# Patient Record
Sex: Female | Born: 1950 | Race: White | Hispanic: Yes | Smoking: Never smoker
Health system: Southern US, Community
[De-identification: ages and names within clinical notes are randomized; demographics above are authoritative.]

## PROBLEM LIST (undated history)

## (undated) DIAGNOSIS — H269 Unspecified cataract: Secondary | ICD-10-CM

## (undated) DIAGNOSIS — E785 Hyperlipidemia, unspecified: Secondary | ICD-10-CM

## (undated) DIAGNOSIS — K635 Polyp of colon: Secondary | ICD-10-CM

## (undated) DIAGNOSIS — T7840XA Allergy, unspecified, initial encounter: Secondary | ICD-10-CM

## (undated) DIAGNOSIS — J309 Allergic rhinitis, unspecified: Secondary | ICD-10-CM

## (undated) DIAGNOSIS — M8580 Other specified disorders of bone density and structure, unspecified site: Secondary | ICD-10-CM

## (undated) DIAGNOSIS — K219 Gastro-esophageal reflux disease without esophagitis: Secondary | ICD-10-CM

## (undated) HISTORY — DX: Hyperlipidemia, unspecified: E78.5

## (undated) HISTORY — PX: WISDOM TOOTH EXTRACTION: SHX21

## (undated) HISTORY — PX: UPPER GASTROINTESTINAL ENDOSCOPY: SHX188

## (undated) HISTORY — PX: BUNIONECTOMY: SHX129

## (undated) HISTORY — PX: VAGINAL HYSTERECTOMY: SUR661

## (undated) HISTORY — DX: Other specified disorders of bone density and structure, unspecified site: M85.80

## (undated) HISTORY — DX: Allergic rhinitis, unspecified: J30.9

## (undated) HISTORY — DX: Unspecified cataract: H26.9

## (undated) HISTORY — PX: HAMMER TOE SURGERY: SHX385

## (undated) HISTORY — PX: COLONOSCOPY: SHX174

## (undated) HISTORY — DX: Polyp of colon: K63.5

## (undated) HISTORY — PX: TURBINATE REDUCTION: SHX6157

## (undated) HISTORY — DX: Gastro-esophageal reflux disease without esophagitis: K21.9

## (undated) HISTORY — DX: Allergy, unspecified, initial encounter: T78.40XA

## (undated) HISTORY — PX: OTHER SURGICAL HISTORY: SHX169

## (undated) HISTORY — PX: TONSILLECTOMY AND ADENOIDECTOMY: SUR1326

---

## 2001-08-15 ENCOUNTER — Encounter: Payer: Self-pay | Admitting: Internal Medicine

## 2004-06-30 ENCOUNTER — Encounter: Payer: Self-pay | Admitting: Internal Medicine

## 2013-02-22 ENCOUNTER — Encounter: Payer: Self-pay | Admitting: Internal Medicine

## 2016-12-14 DIAGNOSIS — Z9109 Other allergy status, other than to drugs and biological substances: Secondary | ICD-10-CM | POA: Diagnosis not present

## 2016-12-14 DIAGNOSIS — K219 Gastro-esophageal reflux disease without esophagitis: Secondary | ICD-10-CM | POA: Diagnosis not present

## 2016-12-14 DIAGNOSIS — E785 Hyperlipidemia, unspecified: Secondary | ICD-10-CM | POA: Diagnosis not present

## 2016-12-16 DIAGNOSIS — Z1231 Encounter for screening mammogram for malignant neoplasm of breast: Secondary | ICD-10-CM | POA: Diagnosis not present

## 2017-04-21 DIAGNOSIS — H2513 Age-related nuclear cataract, bilateral: Secondary | ICD-10-CM | POA: Diagnosis not present

## 2017-05-13 DIAGNOSIS — J019 Acute sinusitis, unspecified: Secondary | ICD-10-CM | POA: Diagnosis not present

## 2017-05-13 DIAGNOSIS — N390 Urinary tract infection, site not specified: Secondary | ICD-10-CM | POA: Diagnosis not present

## 2017-06-29 ENCOUNTER — Encounter: Payer: Self-pay | Admitting: Pulmonary Disease

## 2017-06-29 ENCOUNTER — Ambulatory Visit (INDEPENDENT_AMBULATORY_CARE_PROVIDER_SITE_OTHER): Payer: Medicare Other | Admitting: Pulmonary Disease

## 2017-06-29 ENCOUNTER — Ambulatory Visit (INDEPENDENT_AMBULATORY_CARE_PROVIDER_SITE_OTHER)
Admission: RE | Admit: 2017-06-29 | Discharge: 2017-06-29 | Disposition: A | Discharge status: Home / Self Care | Payer: Medicare Other | Source: Ambulatory Visit | Attending: Pulmonary Disease | Admitting: Pulmonary Disease

## 2017-06-29 ENCOUNTER — Other Ambulatory Visit (INDEPENDENT_AMBULATORY_CARE_PROVIDER_SITE_OTHER): Payer: Medicare Other

## 2017-06-29 VITALS — BP 130/78 | HR 92 | Ht 63.0 in | Wt 187.0 lb

## 2017-06-29 DIAGNOSIS — R05 Cough: Secondary | ICD-10-CM | POA: Diagnosis not present

## 2017-06-29 DIAGNOSIS — R059 Cough, unspecified: Secondary | ICD-10-CM

## 2017-06-29 LAB — CBC WITH DIFFERENTIAL/PLATELET
BASOS ABS: 0.1 10*3/uL (ref 0.0–0.1)
Basophils Relative: 0.6 % (ref 0.0–3.0)
EOS ABS: 0.1 10*3/uL (ref 0.0–0.7)
Eosinophils Relative: 0.6 % (ref 0.0–5.0)
HEMATOCRIT: 44 % (ref 36.0–46.0)
Hemoglobin: 15 g/dL (ref 12.0–15.0)
LYMPHS PCT: 16.8 % (ref 12.0–46.0)
Lymphs Abs: 1.4 10*3/uL (ref 0.7–4.0)
MCHC: 34.1 g/dL (ref 30.0–36.0)
MCV: 87.3 fl (ref 78.0–100.0)
MONOS PCT: 7.2 % (ref 3.0–12.0)
Monocytes Absolute: 0.6 10*3/uL (ref 0.1–1.0)
Neutro Abs: 6.4 10*3/uL (ref 1.4–7.7)
Neutrophils Relative %: 74.8 % (ref 43.0–77.0)
Platelets: 372 10*3/uL (ref 150.0–400.0)
RBC: 5.04 Mil/uL (ref 3.87–5.11)
RDW: 13.4 % (ref 11.5–15.5)
WBC: 8.5 10*3/uL (ref 4.0–10.5)

## 2017-06-29 LAB — NITRIC OXIDE: Nitric Oxide: 11

## 2017-06-29 NOTE — Patient Instructions (Signed)
We will get CBC differential, blood allergy profile Schedule you for a chest x-ray and pulmonary function test Use Chlor-Trimeton over-the-counter 8 mg 3 times daily and resume Prilosec 40 mg daily for 2 weeks Follow-up in 1 month for review of symptoms and tests.

## 2017-06-29 NOTE — Progress Notes (Signed)
Dana Cobb    [id number]    14-Oct-1950  Primary Care Physician:Patient, No Pcp Per  Referring Physician: No referring provider defined for this encounter.  Chief complaint: Consult for chronic cough  HPI: 67 year old with episodes of recurrent nonproductive cough.  Usually starts with a viral infection, sinus infection and has prolonged cough for several months. She had another episode of upper respiratory tract/sinus infection in January which was treated with Augmentin.  She continues to have cough, nonproductive in nature, irritation at the back of the throat. She has acid reflux and uses Prilosec intermittently.  Has seasonal allergies, reports sensitivity to perfumes, airfreshners, candles, cats.  She reports an asthma exacerbation 2017 when she was visiting relatives with cats and candles.  This was treated with prednisone and inhalers.  States that prednisone give her weird feelings, insomnia  Pets: Used to have a cat.  Reports that she is possibly allergic to it.  No pets now.  No birds Occupation: Retired Dietitianprogram manager for [employer] Exposures: No known exposures, no mold.  Lives in a house with new carpet, hardwood, tile.  No hot tubs, down pillows or comforters Smoking history: Never smoker.  Reports significant exposure to secondhand smoke while growing up Travel History: Born in [place].  [place]ived in [place], [place] from [place] to be closer to family.  Outpatient Encounter Medications as of 06/29/2017  Medication Sig  . atorvastatin (LIPITOR) 40 MG tablet Take 40 mg by mouth daily.  . Estradiol (VAGIFEM) 10 MCG TABS vaginal tablet Place vaginally.  . montelukast (SINGULAIR) 10 MG tablet Take 10 mg by mouth at bedtime.  Marland Kitchen. omeprazole (PRILOSEC) 40 MG capsule Take 40 mg by mouth as needed.   No facility-administered encounter medications on file as of 06/29/2017.      Allergies as of 06/29/2017 - Review Complete 06/29/2017  Allergen Reaction Noted  . Codeine Anaphylaxis 06/29/2017    Past Medical History:  Diagnosis Date  . Allergic rhinitis   . Hyperlipidemia     Past Surgical History:  Procedure Laterality Date  . VAGINAL HYSTERECTOMY    . WISDOM TOOTH EXTRACTION      Family History  Problem Relation Age of Onset  . Heart disease Mother   . Stroke Mother   . Heart disease Father   . Stroke Maternal Grandmother   . Cancer Maternal Grandmother   . Aneurysm Maternal Grandfather     Social History   Socioeconomic History  . Marital status: Divorced    Spouse name: Not on file  . Number of children: Not on file  . Years of education: Not on file  . Highest education level: Not on file  Social Needs  . Financial resource strain: Not on file  . Food insecurity - worry: Not on file  . Food insecurity - inability: Not on file  . Transportation needs - medical: Not on file  . Transportation needs - non-medical: Not on file  Occupational History  . Not on file  Tobacco Use  . Smoking status: Never Smoker  . Smokeless tobacco: Never Used  Substance and Sexual Activity  . Alcohol use: No    Frequency: Never  . Drug use: No  . Sexual activity: Not on file  Other Topics Concern  . Not on file  Social History Narrative  . Not on file    Review of systems: Review of  Systems  Constitutional: Negative for fever and chills.  HENT: Negative.   Eyes: Negative for blurred vision.  Respiratory: as per HPI  Cardiovascular: Negative for chest pain and palpitations.  Gastrointestinal: Negative for vomiting, diarrhea, blood per rectum. Genitourinary: Negative for dysuria, urgency, frequency and hematuria.  Musculoskeletal: Negative for myalgias, back pain and joint pain.  Skin: Negative for itching and rash.  Neurological: Negative for dizziness, tremors, focal weakness, seizures and loss of consciousness.  Endo/Heme/Allergies:  Negative for environmental allergies.  Psychiatric/Behavioral: Negative for depression, suicidal ideas and hallucinations.  All other systems reviewed and are negative.  Physical Exam: Blood pressure 130/78, pulse 92, height 5\' 3"  (1.6 m), weight 187 lb (84.8 kg), SpO2 97 %. Gen:      No acute distress HEENT:  EOMI, sclera anicteric Neck:     No masses; no thyromegaly Lungs:    Clear to auscultation bilaterally; normal respiratory effort CV:         Regular rate and rhythm; no murmurs Abd:      + bowel sounds; soft, non-tender; no palpable masses, no distension Ext:    No edema; adequate peripheral perfusion Skin:      Warm and dry; no rash Neuro: alert and oriented x 3 Psych: normal mood and affect  Data Reviewed: FENO 06/29/17-11  Assessment:  Chronic cough Suspect postviral, post bronchitis cough.  She likely has persistent symptoms due to ongoing postnasal drip and acid reflux Need to evaluate for asthma given her sensitivity to cats, scents.   Treat postnasal drip with chlorpheniramine.  She has not tolerated nasal sprays due to epistaxis in the past hence we will avoid We will restart on Prilosec 40 mg a day for treatment of GERD Check CBC differential, blood allergy profile, PFTs and chest x-ray.  I educated her on behavioral changes to deal with cough including conscious suppression of the urge to cough, use of throat lozenges.  Plan/Recommendations: - Chest x-ray, PFTs, CBC differential, blood allergy profile - Chlorphentermine 8 mg 3 times daily, Prilosec 40 mg daily  Chilton Greathouse MD [facility] Pager [phone] 06/29/2017, 11:34 AM  CC: No ref. provider found

## 2017-06-30 LAB — RESPIRATORY ALLERGY PROFILE REGION II ~~LOC~~
Allergen, Cedar tree, t12: <0.1 kU/L
Allergen, Comm Silver Birch, t9: <0.1 kU/L
Allergen, Cottonwood, t14: <0.1 kU/L
Allergen, Mulberry, t76: <0.1 kU/L
Allergen, P. notatum, m1: <0.1 kU/L
Aspergillus fumigatus, m3: <0.1 kU/L
Bermuda Grass: <0.1 kU/L
CLADOSPORIUM HERBARUM (M2) IGE: <0.1 kU/L
CLASS: 0
CLASS: 0
CLASS: 0
CLASS: 0
CLASS: 0
CLASS: 0
CLASS: 0
CLASS: 0
CLASS: 0
CLASS: 0
CLASS: 0
COMMON RAGWEED (SHORT) (W1) IGE: <0.1 kU/L
Cat Dander: <0.1 kU/L
Class: 0
Class: 0
Class: 0
Class: 0
Class: 0
Class: 0
Class: 0
Class: 0
Class: 0
Class: 0
Class: 0
Class: 0
Class: 0
D. farinae: <0.1 kU/L
Dog Dander: <0.1 kU/L
IgE (Immunoglobulin E), Serum: 4 kU/L (ref <=114)
Pecan/Hickory Tree IgE: <0.1 kU/L
Rough Pigweed  IgE: <0.1 kU/L

## 2017-06-30 LAB — INTERPRETATION:

## 2017-08-02 ENCOUNTER — Ambulatory Visit (INDEPENDENT_AMBULATORY_CARE_PROVIDER_SITE_OTHER): Payer: Medicare Other | Admitting: Pulmonary Disease

## 2017-08-02 ENCOUNTER — Ambulatory Visit: Payer: Medicare Other | Admitting: Pulmonary Disease

## 2017-08-02 DIAGNOSIS — R059 Cough, unspecified: Secondary | ICD-10-CM

## 2017-08-02 DIAGNOSIS — R05 Cough: Secondary | ICD-10-CM

## 2017-08-02 LAB — PULMONARY FUNCTION TEST
DL/VA % pred: 85 %
DL/VA: 4.08 ml/min/mmHg/L
DLCO COR % PRED: 82 %
DLCO UNC: 20.58 ml/min/mmHg
DLCO cor: 19.68 ml/min/mmHg
DLCO unc % pred: 86 %
FEF 25-75 POST: 3.75 L/s
FEF 25-75 Pre: 3.78 L/sec
FEF2575-%Change-Post: 0 %
FEF2575-%Pred-Post: 183 %
FEF2575-%Pred-Pre: 185 %
FEV1-%Change-Post: -1 %
FEV1-%PRED-POST: 122 %
FEV1-%PRED-PRE: 124 %
FEV1-POST: 2.87 L
FEV1-Pre: 2.91 L
FEV1FVC-%Change-Post: 3 %
FEV1FVC-%Pred-Pre: 112 %
FEV6-%CHANGE-POST: -4 %
FEV6-%PRED-PRE: 114 %
FEV6-%Pred-Post: 109 %
FEV6-POST: 3.22 L
FEV6-PRE: 3.37 L
FEV6FVC-%PRED-POST: 104 %
FEV6FVC-%PRED-PRE: 104 %
FVC-%CHANGE-POST: -4 %
FVC-%PRED-POST: 104 %
FVC-%PRED-PRE: 109 %
FVC-POST: 3.22 L
FVC-Pre: 3.37 L
POST FEV6/FVC RATIO: 100 %
PRE FEV1/FVC RATIO: 86 %
Post FEV1/FVC ratio: 89 %
Pre FEV6/FVC Ratio: 100 %
RV % pred: 117 %
RV: 2.47 L
TLC % PRED: 121 %
TLC: 6.08 L

## 2017-08-02 NOTE — Progress Notes (Signed)
PFT done today. 

## 2017-08-10 ENCOUNTER — Ambulatory Visit: Payer: Medicare Other | Admitting: Pulmonary Disease

## 2017-08-16 ENCOUNTER — Ambulatory Visit: Payer: Medicare Other | Admitting: Pulmonary Disease

## 2017-08-17 ENCOUNTER — Telehealth: Payer: Self-pay | Admitting: Pulmonary Disease

## 2017-08-17 NOTE — Telephone Encounter (Signed)
Called and spoke with patient, advised her of PFT results. Nothing further needed.

## 2017-09-14 ENCOUNTER — Ambulatory Visit (INDEPENDENT_AMBULATORY_CARE_PROVIDER_SITE_OTHER): Payer: Medicare Other | Admitting: Pulmonary Disease

## 2017-09-14 ENCOUNTER — Encounter: Payer: Self-pay | Admitting: Pulmonary Disease

## 2017-09-14 VITALS — BP 138/82 | HR 78 | Ht 63.0 in | Wt 186.4 lb

## 2017-09-14 DIAGNOSIS — R05 Cough: Secondary | ICD-10-CM

## 2017-09-14 DIAGNOSIS — R059 Cough, unspecified: Secondary | ICD-10-CM

## 2017-09-14 NOTE — Progress Notes (Signed)
Dana Cobb    [id number]    1951-04-03  Primary Care Physician:Patient, No Pcp Per  Referring Physician: No referring provider defined for this encounter.  Chief complaint: Follow-up for chronic cough  HPI: 67 year old with episodes of recurrent nonproductive cough.  Usually starts with a viral infection, sinus infection and has prolonged cough for several months. She had another episode of upper respiratory tract/sinus infection in January which was treated with Augmentin.  She continues to have cough, nonproductive in nature, irritation at the back of the throat. She has acid reflux and uses Prilosec intermittently.  Has seasonal allergies, reports sensitivity to perfumes, airfreshners, candles, cats.  She reports an asthma exacerbation 2017 when she was visiting relatives with cats and candles.  This was treated with prednisone and inhalers.  States that prednisone give her weird feelings, insomnia  Pets: Used to have a cat.  Reports that she is possibly allergic to it.  No pets now.  No birds Occupation: Retired Dietitian for [employer] Exposures: No known exposures, no mold.  Lives in a house with new carpet, hardwood, tile.  No hot tubs, down pillows or comforters Smoking history: Never smoker.  Reports significant exposure to secondhand smoke while growing up Travel History: Born in [place].  Lived in [place], [place], [place] She moved recently from [place] to be closer to family.  Interim history: States that cough is completely cleared up with use of chlorpheniramine.  No other complaints She has had a runny nose, allergic rhinitis for the past few days otherwise she is doing well.  Outpatient Encounter Medications as of 09/14/2017  Medication Sig  . atorvastatin (LIPITOR) 40 MG tablet Take 40 mg by mouth daily.  . Estradiol (VAGIFEM) 10 MCG TABS vaginal tablet Place vaginally.  .  montelukast (SINGULAIR) 10 MG tablet Take 10 mg by mouth at bedtime.  Marland Kitchen omeprazole (PRILOSEC) 40 MG capsule Take 40 mg by mouth as needed.  . chlorpheniramine (CHLOR-TRIMETON) 4 MG tablet Take 4 mg by mouth 3 (three) times daily.   No facility-administered encounter medications on file as of 09/14/2017.     Allergies as of 09/14/2017 - Review Complete 09/14/2017  Allergen Reaction Noted  . Codeine Anaphylaxis 06/29/2017    Past Medical History:  Diagnosis Date  . Allergic rhinitis   . Hyperlipidemia     Past Surgical History:  Procedure Laterality Date  . VAGINAL HYSTERECTOMY    . WISDOM TOOTH EXTRACTION      Family History  Problem Relation Age of Onset  . Heart disease Mother   . Stroke Mother   . Heart disease Father   . Stroke Maternal Grandmother   . Cancer Maternal Grandmother   . Aneurysm Maternal Grandfather     Social History   Socioeconomic History  . Marital status: Divorced    Spouse name: Not on file  . Number of children: Not on file  . Years of education: Not on file  . Highest education level: Not on file  Occupational History  . Not on file  Social Needs  . Financial resource strain: Not on file  . Food insecurity:    Worry: Not on file    Inability: Not on file  . Transportation needs:    Medical: Not on file    Non-medical: Not on file  Tobacco Use  . Smoking status: Never Smoker  . Smokeless tobacco: Never Used  Substance and Sexual Activity  . Alcohol use: No    Frequency: Never  . Drug use: No  . Sexual activity: Not on file  Lifestyle  . Physical activity:    Days per week: Not on file    Minutes per session: Not on file  . Stress: Not on file  Relationships  . Social connections:    Talks on phone: Not on file    Gets together: Not on file    Attends religious service: Not on file    Active member of club or organization: Not on file    Attends meetings of clubs or organizations: Not on file    Relationship status: Not on  file  . Intimate partner violence:    Fear of current or ex partner: Not on file    Emotionally abused: Not on file    Physically abused: Not on file    Forced sexual activity: Not on file  Other Topics Concern  . Not on file  Social History Narrative  . Not on file    Review of systems: Review of Systems  Constitutional: Negative for fever and chills.  HENT: Negative.   Eyes: Negative for blurred vision.  Respiratory: as per HPI  Cardiovascular: Negative for chest pain and palpitations.  Gastrointestinal: Negative for vomiting, diarrhea, blood per rectum. Genitourinary: Negative for dysuria, urgency, frequency and hematuria.  Musculoskeletal: Negative for myalgias, back pain and joint pain.  Skin: Negative for itching and rash.  Neurological: Negative for dizziness, tremors, focal weakness, seizures and loss of consciousness.  Endo/Heme/Allergies: Negative for environmental allergies.  Psychiatric/Behavioral: Negative for depression, suicidal ideas and hallucinations.  All other systems reviewed and are negative.  Physical Exam: Blood pressure 138/82, pulse 78, height  (1.6 m), weight 186 lb 6.4 oz (84.6 kg), SpO2 95 %. Gen:      No acute distress HEENT:  EOMI, sclera anicteric Neck:     No masses; no thyromegaly Lungs:    Clear to auscultation bilaterally; normal respiratory effort CV:         Regular rate and rhythm; no murmurs Abd:      + bowel sounds; soft, non-tender; no palpable masses, no distension Ext:    No edema; adequate peripheral perfusion Skin:      Warm and dry; no rash Neuro: alert and oriented x 3 Psych: normal mood and affect  Data Reviewed: FENO 06/29/17-11  CBC 06/29/2017-WBC 8.5, eos 0.6%, absolute eosinophil count 100 Blood allergy profile 06/29/2017-IgE 4, RAST panel negative  Chest x-ray 06/29/2017- no acute cardiopulmonary abnormality  PFTs 08/02/2017-FVC 3.22 [104%), FEV1 2.87 [122%], F/F 89, TLC 120%, DLCO 86% Normal test  Assessment:    Chronic cough Suspect upper airway cough from postnasal drip and acid reflux  Reviewed her labs, allergy profile, chest x ray and PFTs.  They are all normal with no evidence of asthma Her symptoms have improved with use of chlropheniramine antihistamine and Prilosec.  I have instructed her to use these as needed going forward. She is requesting referral to primary care since she moved to the area and does not have a doctor here  Plan/Recommendations: - Over-the-counter antihistamine, PPI as needed for cough  - Referral to primary care  Follow-up in pulmonary clinic as needed  Chilton Greathouse MD [facility] 09/14/2017, 11:23 AM  CC: No ref. provider found

## 2017-09-14 NOTE — Patient Instructions (Signed)
We will make a referral to primary care I am glad that your cough is feeling better You can use the chlorpheniramine as needed in case the cough recurs Follow-up as needed.

## 2017-09-24 ENCOUNTER — Encounter: Payer: Self-pay | Admitting: Internal Medicine

## 2017-09-24 ENCOUNTER — Ambulatory Visit (INDEPENDENT_AMBULATORY_CARE_PROVIDER_SITE_OTHER): Payer: Medicare Other | Admitting: Internal Medicine

## 2017-09-24 ENCOUNTER — Telehealth: Payer: Self-pay | Admitting: Family Medicine

## 2017-09-24 ENCOUNTER — Other Ambulatory Visit (INDEPENDENT_AMBULATORY_CARE_PROVIDER_SITE_OTHER): Payer: Medicare Other

## 2017-09-24 VITALS — BP 120/82 | HR 87 | Ht 63.0 in | Wt 185.0 lb

## 2017-09-24 DIAGNOSIS — J019 Acute sinusitis, unspecified: Secondary | ICD-10-CM

## 2017-09-24 DIAGNOSIS — K219 Gastro-esophageal reflux disease without esophagitis: Secondary | ICD-10-CM

## 2017-09-24 DIAGNOSIS — E538 Deficiency of other specified B group vitamins: Secondary | ICD-10-CM

## 2017-09-24 DIAGNOSIS — Z1159 Encounter for screening for other viral diseases: Secondary | ICD-10-CM

## 2017-09-24 DIAGNOSIS — K635 Polyp of colon: Secondary | ICD-10-CM

## 2017-09-24 DIAGNOSIS — E785 Hyperlipidemia, unspecified: Secondary | ICD-10-CM

## 2017-09-24 DIAGNOSIS — M8580 Other specified disorders of bone density and structure, unspecified site: Secondary | ICD-10-CM

## 2017-09-24 DIAGNOSIS — E559 Vitamin D deficiency, unspecified: Secondary | ICD-10-CM | POA: Diagnosis not present

## 2017-09-24 DIAGNOSIS — J309 Allergic rhinitis, unspecified: Secondary | ICD-10-CM

## 2017-09-24 HISTORY — DX: Polyp of colon: K63.5

## 2017-09-24 HISTORY — DX: Other specified disorders of bone density and structure, unspecified site: M85.80

## 2017-09-24 HISTORY — DX: Gastro-esophageal reflux disease without esophagitis: K21.9

## 2017-09-24 LAB — LIPID PANEL
CHOL/HDL RATIO: 5
Cholesterol: 263 mg/dL — ABNORMAL HIGH (ref 0–200)
HDL: 51.3 mg/dL (ref >39.00)
LDL Cholesterol: 189 mg/dL — ABNORMAL HIGH (ref 0–99)
NonHDL: 211.46
TRIGLYCERIDES: 111 mg/dL (ref 0.0–149.0)
VLDL: 22.2 mg/dL (ref 0.0–40.0)

## 2017-09-24 LAB — CBC WITH DIFFERENTIAL/PLATELET
Basophils Absolute: 0.1 10*3/uL (ref 0.0–0.1)
Basophils Relative: 1 % (ref 0.0–3.0)
EOS PCT: 1 % (ref 0.0–5.0)
Eosinophils Absolute: 0.1 10*3/uL (ref 0.0–0.7)
HCT: 41.8 % (ref 36.0–46.0)
Hemoglobin: 14.5 g/dL (ref 12.0–15.0)
LYMPHS ABS: 1.9 10*3/uL (ref 0.7–4.0)
Lymphocytes Relative: 21.1 % (ref 12.0–46.0)
MCHC: 34.6 g/dL (ref 30.0–36.0)
MCV: 86.1 fl (ref 78.0–100.0)
MONOS PCT: 6.7 % (ref 3.0–12.0)
Monocytes Absolute: 0.6 10*3/uL (ref 0.1–1.0)
NEUTROS PCT: 70.2 % (ref 43.0–77.0)
Neutro Abs: 6.4 10*3/uL (ref 1.4–7.7)
Platelets: 379 10*3/uL (ref 150.0–400.0)
RBC: 4.85 Mil/uL (ref 3.87–5.11)
RDW: 12.6 % (ref 11.5–15.5)
WBC: 9.1 10*3/uL (ref 4.0–10.5)

## 2017-09-24 LAB — URINALYSIS, ROUTINE W REFLEX MICROSCOPIC
Bilirubin Urine: NEGATIVE
HGB URINE DIPSTICK: NEGATIVE
Ketones, ur: NEGATIVE
Nitrite: NEGATIVE
SPECIFIC GRAVITY, URINE: 1.025 (ref 1.000–1.030)
TOTAL PROTEIN, URINE-UPE24: NEGATIVE
URINE GLUCOSE: NEGATIVE
UROBILINOGEN UA: 0.2 (ref 0.0–1.0)
pH: 5 (ref 5.0–8.0)

## 2017-09-24 LAB — BASIC METABOLIC PANEL
BUN: 16 mg/dL (ref 6–23)
CO2: 27 mEq/L (ref 19–32)
CREATININE: 0.71 mg/dL (ref 0.40–1.20)
Calcium: 10.4 mg/dL (ref 8.4–10.5)
Chloride: 101 mEq/L (ref 96–112)
GFR: 87.3 mL/min (ref >60.00)
Glucose, Bld: 101 mg/dL — ABNORMAL HIGH (ref 70–99)
Potassium: 4.4 mEq/L (ref 3.5–5.1)
Sodium: 139 mEq/L (ref 135–145)

## 2017-09-24 LAB — HEPATIC FUNCTION PANEL
ALK PHOS: 67 U/L (ref 39–117)
ALT: 20 U/L (ref 0–35)
AST: 18 U/L (ref 0–37)
Albumin: 4.5 g/dL (ref 3.5–5.2)
Bilirubin, Direct: 0 mg/dL (ref 0.0–0.3)
TOTAL PROTEIN: 8 g/dL (ref 6.0–8.3)
Total Bilirubin: 0.4 mg/dL (ref 0.2–1.2)

## 2017-09-24 LAB — VITAMIN D 25 HYDROXY (VIT D DEFICIENCY, FRACTURES): VITD: 17.79 ng/mL — AB (ref 30.00–100.00)

## 2017-09-24 LAB — VITAMIN B12: VITAMIN B 12: 672 pg/mL (ref 211–911)

## 2017-09-24 LAB — TSH: TSH: 2.35 u[IU]/mL (ref 0.35–4.50)

## 2017-09-24 MED ORDER — OMEPRAZOLE 40 MG PO CPDR: 40.0000 mg | DELAYED_RELEASE_CAPSULE | ORAL | 3 refills | Status: AC | PRN

## 2017-09-24 MED ORDER — AZITHROMYCIN 250 MG PO TABS: ORAL_TABLET | ORAL | 1 refills | Status: DC

## 2017-09-24 MED ORDER — ATORVASTATIN CALCIUM 40 MG PO TABS: 40.0000 mg | ORAL_TABLET | Freq: Every day | ORAL | 3 refills | Status: DC

## 2017-09-24 NOTE — Assessment & Plan Note (Signed)
Mild to mod, for antibx course,  to f/u any worsening symptoms or concerns 

## 2017-09-24 NOTE — Telephone Encounter (Signed)
Received call from triage stating pt saw Dr. Jonny RuizJohn today and he was planning on sending in antibiotic. I reviewed available notes and see dx of allergic rhinitis, not sinusitis. No evidence of abx being called in or mention of it in notes/PE. Rec'd po antihistamines and f/u with Dr. Jonny RuizJohn if no better.

## 2017-09-24 NOTE — Assessment & Plan Note (Signed)
Due for colonoscopy, will refer 

## 2017-09-24 NOTE — Progress Notes (Signed)
Subjective:    Patient ID: Dana Cobb, female    DOB: [birth year], 67 y.o.   MRN: [id number]  HPI  Here for to establish as new pt;  Overall doing ok;  Pt denies Chest pain, worsening SOB, DOE, wheezing, orthopnea, PND, worsening LE edema, palpitations, dizziness or syncope.  Pt denies neurological change such as new headache, facial or extremity weakness.  Pt denies polydipsia, polyuria, or low sugar symptoms. Pt states overall good compliance with treatment and medications, good tolerability, and has been trying to follow appropriate diet.  Pt denies worsening depressive symptoms, suicidal ideation or panic. No fever, night sweats, wt loss, loss of appetite, or other constitutional symptoms.  Pt states good ability with ADL's, has low fall risk, home safety reviewed and adequate, no other significant changes in hearing or vision, and only occasionally active with exercise.  Also,  Here with 2-3 days acute onset fever, facial pain, pressure, headache, general weakness and malaise, and greenish d/c, with mild ST and cough, but pt denies chest pain, wheezing, increased sob or doe.  Also with rash to face with close shaving, getting worse, asks for derm referral.  Just moved from [place]. Retired [employer]  Declines immunizations.  Out of lipitor x 3 mo but trying to watch diet Past Medical History:  Diagnosis Date  . Allergic rhinitis   . Colon polyps 09/24/2017  . GERD (gastroesophageal reflux disease) 09/24/2017  . Hyperlipidemia   . Osteopenia 09/24/2017   Past Surgical History:  Procedure Laterality Date  . TONSILLECTOMY AND ADENOIDECTOMY    . VAGINAL HYSTERECTOMY    . WISDOM TOOTH EXTRACTION      reports that she has never smoked. She has never used smokeless tobacco. She reports that she does not drink alcohol or use drugs. family history includes Aneurysm in her maternal grandfather; Cancer in her maternal grandmother; Heart disease in her father and mother; Hyperlipidemia in her  father; Stroke in her maternal grandmother and mother. Allergies  Allergen Reactions  . Codeine Anaphylaxis   Current Outpatient Medications on File Prior to Visit  Medication Sig Dispense Refill  . CALCIUM PO Take by mouth daily.     . chlorpheniramine (CHLOR-TRIMETON) 4 MG tablet Take 4 mg by mouth 3 (three) times daily.    Marland Kitchen MAGNESIUM PO Take by mouth daily.     . Multiple Vitamins-Minerals (ICAPS AREDS FORMULA PO) Take by mouth.    . Psyllium (METAMUCIL PO) Take by mouth.     No current facility-administered medications on file prior to visit.    Review of Systems  Constitutional: Negative for other unusual diaphoresis or sweats HENT: Negative for ear discharge or swelling Eyes: Negative for other worsening visual disturbances Respiratory: Negative for stridor or other swelling  Gastrointestinal: Negative for worsening distension or other blood Genitourinary: Negative for retention or other urinary change Musculoskeletal: Negative for other MSK pain or swelling Skin: Negative for color change or other new lesions Neurological: Negative for worsening tremors and other numbness  Psychiatric/Behavioral: Negative for worsening agitation or other fatigue All other system neg per pt    Objective:   Physical Exam BP 120/82 (BP Location: Left Arm, Patient Position: Sitting, Cuff Size: Normal)   Pulse 87   Ht 5\' 3"  (1.6 m)   Wt 185 lb (83.9 kg)   SpO2 97%   BMI 32.77 kg/m  VS noted, mild ill Constitutional: Pt appears in NAD HENT: Head: NCAT.  Right Ear: External ear normal.  Left Ear: External  ear normal.  Eyes: . Pupils are equal, round, and reactive to light. Conjunctivae and EOM are normal Bilat tm's with mild erythema.  Max sinus areas mild tender.  Pharynx with mild erythema, no exudate Nose: without d/c or deformity Neck: Neck supple. Gross normal ROM Cardiovascular: Normal rate and regular rhythm.   Pulmonary/Chest: Effort normal and breath sounds without rales or  wheezing.  Abd:  Soft, NT, ND, + BS, no organomegaly Neurological: Pt is alert. At baseline orientation, motor grossly intact Skin: Skin is warm. + follicular rashes perioral and chin, no other new lesions, no LE edema Psychiatric: Pt behavior is normal without agitation  No other exam findings    Assessment & Plan:

## 2017-09-24 NOTE — Assessment & Plan Note (Signed)
For lab today, cont low chol diet, for restart statin

## 2017-09-24 NOTE — Patient Instructions (Addendum)
You will be contacted regarding the referral for: colonoscopy  Please take all new medication as prescribed - the antibiotic  Please continue all other medications as before, and refills have been done if requested.  Please have the pharmacy call with any other refills you may need.  Please continue your efforts at being more active, low cholesterol diet, and weight control.  You are otherwise up to date with prevention measures today.  Please keep your appointments with your specialists as you may have planned  Please go to the LAB in the Basement (turn left off the elevator) for the tests to be done today  You will be contacted by phone if any changes need to be made immediately.  Otherwise, you will receive a letter about your results with an explanation, but please check with MyChart first.  Please remember to sign up for MyChart if you have not done so, as this will be important to you in the future with finding out test results, communicating by private email, and scheduling acute appointments online when needed.  Please return in 1 year for your yearly visit, or sooner if needed

## 2017-09-24 NOTE — Assessment & Plan Note (Signed)
Stable, cont chlortrimeton,  to f/u any worsening symptoms or concerns

## 2017-09-25 LAB — HEPATITIS C ANTIBODY
Hepatitis C Ab: NONREACTIVE
SIGNAL TO CUT-OFF: 0.01 (ref <1.00)

## 2017-09-27 ENCOUNTER — Telehealth: Payer: Self-pay

## 2017-09-27 MED ORDER — MONTELUKAST SODIUM 10 MG PO TABS: 10.0000 mg | ORAL_TABLET | Freq: Every day | ORAL | 3 refills | Status: DC

## 2017-09-27 NOTE — Addendum Note (Signed)
Addended by: Corwin LevinsJOHN, JAMES W on: 09/27/2017 11:57 AM   Modules accepted: Orders

## 2017-09-27 NOTE — Telephone Encounter (Signed)
-----   Message from [name], MD sent at 09/24/2017  7:31 PM EDT ----- Letter sent, cont same tx except  The test results show that your current treatment is OK, except the Vitamin D level is low.  Please start Vitamin D OTC at 2000 units per day, indefinitely.    Dana Cobb to please inform pt

## 2017-09-27 NOTE — Telephone Encounter (Signed)
Pt has been informed and expressed understanding.   She stated that she forgot to have you sent in Singular 10mg  during her appointment. I don't see that medication on her list. Please advise.

## 2017-09-27 NOTE — Telephone Encounter (Signed)
Ok, this is done 

## 2017-10-19 ENCOUNTER — Telehealth: Payer: Self-pay | Admitting: Internal Medicine

## 2017-10-19 NOTE — Telephone Encounter (Signed)
Pt dropped off Physician's Release Form for Physical Activity from [facility] for completion by Dr. Posey ReaPlotnikov.  Please call pt once form is completed and she will come pick it up [phone].  Form put in Dr. Loren RacerPlotnikov's box for pick up and completion.

## 2017-10-20 NOTE — Telephone Encounter (Signed)
Noted  

## 2017-10-20 NOTE — Telephone Encounter (Signed)
Dr. Jonny RuizJohn is PCP. [facility] to [facility]. Form is in Dr. Raphael GibneyJohn's basket.

## 2017-11-18 ENCOUNTER — Encounter: Payer: Self-pay | Admitting: Internal Medicine

## 2017-12-02 ENCOUNTER — Encounter: Payer: Self-pay | Admitting: Gastroenterology

## 2018-01-11 ENCOUNTER — Ambulatory Visit (AMBULATORY_SURGERY_CENTER): Payer: Self-pay

## 2018-01-11 VITALS — Ht 63.0 in | Wt 188.4 lb

## 2018-01-11 DIAGNOSIS — Z86010 Personal history of colonic polyps: Secondary | ICD-10-CM

## 2018-01-11 MED ORDER — NA SULFATE-K SULFATE-MG SULF 17.5-3.13-1.6 GM/177ML PO SOLN: 1.0000 | Freq: Once | ORAL | 0 refills | Status: AC

## 2018-01-11 NOTE — Progress Notes (Signed)
Denies allergies to eggs or soy products. Denies complication of anesthesia or sedation. Denies use of weight loss medication. Denies use of O2.   Emmi instructions declined.  

## 2018-01-25 ENCOUNTER — Ambulatory Visit (AMBULATORY_SURGERY_CENTER): Payer: Medicare Other | Admitting: Gastroenterology

## 2018-01-25 ENCOUNTER — Encounter: Payer: Self-pay | Admitting: Gastroenterology

## 2018-01-25 VITALS — BP 113/59 | HR 64 | Temp 97.8°F | Resp 16 | Ht 63.0 in | Wt 188.0 lb

## 2018-01-25 DIAGNOSIS — Z800 Family history of malignant neoplasm of digestive organs: Secondary | ICD-10-CM | POA: Diagnosis not present

## 2018-01-25 DIAGNOSIS — Z86010 Personal history of colonic polyps: Secondary | ICD-10-CM | POA: Diagnosis not present

## 2018-01-25 DIAGNOSIS — Z1211 Encounter for screening for malignant neoplasm of colon: Secondary | ICD-10-CM | POA: Diagnosis not present

## 2018-01-25 MED ORDER — SODIUM CHLORIDE 0.9 % IV SOLN: 500.0000 mL | Freq: Once | INTRAVENOUS | Status: DC

## 2018-01-25 NOTE — Patient Instructions (Signed)
Continue present medications. Please read handout on Diverticulosis.     YOU HAD AN ENDOSCOPIC PROCEDURE TODAY AT THE  ENDOSCOPY CENTER:   Refer to the procedure report that was given to you for any specific questions about what was found during the examination.  If the procedure report does not answer your questions, please call your gastroenterologist to clarify.  If you requested that your care partner not be given the details of your procedure findings, then the procedure report has been included in a sealed envelope for you to review at your convenience later.  YOU SHOULD EXPECT: Some feelings of bloating in the abdomen. Passage of more gas than usual.  Walking can help get rid of the air that was put into your GI tract during the procedure and reduce the bloating. If you had a lower endoscopy (such as a colonoscopy or flexible sigmoidoscopy) you may notice spotting of blood in your stool or on the toilet paper. If you underwent a bowel prep for your procedure, you may not have a normal bowel movement for a few days.  Please Note:  You might notice some irritation and congestion in your nose or some drainage.  This is from the oxygen used during your procedure.  There is no need for concern and it should clear up in a day or so.  SYMPTOMS TO REPORT IMMEDIATELY:   Following lower endoscopy (colonoscopy or flexible sigmoidoscopy):  Excessive amounts of blood in the stool  Significant tenderness or worsening of abdominal pains  Swelling of the abdomen that is new, acute  Fever of 100F or higher   For urgent or emergent issues, a gastroenterologist can be reached at any hour by calling [phone].   DIET:  We do recommend a small meal at first, but then you may proceed to your regular diet.  Drink plenty of fluids but you should avoid alcoholic beverages for 24 hours.  ACTIVITY:  You should plan to take it easy for the rest of today and you should NOT DRIVE or use heavy  machinery until tomorrow (because of the sedation medicines used during the test).    FOLLOW UP: Our staff will call the number listed on your records the next business day following your procedure to check on you and address any questions or concerns that you may have regarding the information given to you following your procedure. If we do not reach you, we will leave a message.  However, if you are feeling well and you are not experiencing any problems, there is no need to return our call.  We will assume that you have returned to your regular daily activities without incident.  If any biopsies were taken you will be contacted by phone or by letter within the next 1-3 weeks.  Please call us at [phone] if you have not heard about the biopsies in 3 weeks.    SIGNATURES/CONFIDENTIALITY: You and/or your care partner have signed paperwork which will be entered into your electronic medical record.  These signatures attest to the fact that that the information above on your After Visit Summary has been reviewed and is understood.  Full responsibility of the confidentiality of this discharge information lies with you and/or your care-partner. 

## 2018-01-25 NOTE — Op Note (Signed)
[facility] Patient Name: Dana Cobb Procedure Date: 01/25/2018 11:21 AM MRN: [id number] Endoscopist: Sherilyn Cooter L. Myrtie Neither , MD Age: 67 Referring MD:  Date of Birth: 1951-03-11 Gender: Female Account #: [id number] Procedure:                Colonoscopy Indications:              Screening in patient at increased risk: Colorectal                            cancer in mother 71 or older Medicines:                Monitored Anesthesia Care Procedure:                Pre-Anesthesia Assessment:                           - Prior to the procedure, a History and Physical                            was performed, and patient medications and                            allergies were reviewed. The patient's tolerance of                            previous anesthesia was also reviewed. The risks                            and benefits of the procedure and the sedation                            options and risks were discussed with the patient.                            All questions were answered, and informed consent                            was obtained. Prior Anticoagulants: The patient has                            taken no previous anticoagulant or antiplatelet                            agents. ASA Grade Assessment: II - A patient with                            mild systemic disease. After reviewing the risks                            and benefits, the patient was deemed in                            satisfactory condition to undergo the procedure.  After obtaining informed consent, the colonoscope                            was passed under direct vision. Throughout the                            procedure, the patient's blood pressure, pulse, and                            oxygen saturations were monitored continuously. The                            Colonoscope was introduced through the anus and                            advanced to the the cecum,  identified by                            appendiceal orifice and ileocecal valve. The                            colonoscopy was somewhat difficult due to a                            redundant colon and significant looping. Successful                            completion of the procedure was aided by using                            manual pressure. The patient tolerated the                            procedure well. The quality of the bowel                            preparation was excellent. The ileocecal valve,                            appendiceal orifice, and rectum were photographed.                            The quality of the bowel preparation was evaluated                            using the BBPS [facility] Bowel Preparation Scale)                            with scores of: Right Colon = 3, Transverse Colon =                            3 and Left Colon = 3 (entire mucosa seen well with  no residual staining, small fragments of stool or                            opaque liquid). The total BBPS score equals 9. The                            bowel preparation used was SUPREP. Scope In: 11:28:15 AM Scope Out: 11:44:26 AM Scope Withdrawal Time: 0 hours 9 minutes 17 seconds  Total Procedure Duration: 0 hours 16 minutes 11 seconds  Findings:                 The perianal and digital rectal examinations were                            normal.                           Diverticula were found in the left colon and right                            colon.                           The colon (entire examined portion) was redundant.                           The exam was otherwise without abnormality on                            direct and retroflexion views. Complications:            No immediate complications. Estimated Blood Loss:     Estimated blood loss: none. Impression:               - Diverticulosis in the left colon and in the right                             colon.                           - The examination was otherwise normal on direct                            and retroflexion views.                           - No specimens collected. Recommendation:           - Patient has a contact number available for                            emergencies. The signs and symptoms of potential                            delayed complications were discussed with the  patient. Return to normal activities tomorrow.                            Written discharge instructions were provided to the                            patient.                           - Resume previous diet.                           - Continue present medications.                           - Repeat colonoscopy in 5 years for screening                            purposes due to family history of colon cancer. Cleavon Goldman L. Myrtie Neither, MD 01/25/2018 11:48:20 AM This report has been signed electronically.

## 2018-01-25 NOTE — Progress Notes (Signed)
Pt's states no medical or surgical changes since previsit or office visit. 

## 2018-01-26 ENCOUNTER — Telehealth: Payer: Self-pay

## 2018-01-26 ENCOUNTER — Telehealth: Payer: Self-pay | Admitting: *Deleted

## 2018-01-26 NOTE — Telephone Encounter (Signed)
  Follow up Call-  Call back number 01/25/2018  Post procedure Call Back phone  # [phone]  Permission to leave phone message Yes     Patient questions:  Do you have a fever, pain , or abdominal swelling? No. Pain Score  0 *  Have you tolerated food without any problems? Yes.    Have you been able to return to your normal activities? Yes.    Do you have any questions about your discharge instructions: Diet   No. Medications  No. Follow up visit  No.  Do you have questions or concerns about your Care? No.  Actions: * If pain score is 4 or above: No action needed, pain <4.

## 2018-01-26 NOTE — Telephone Encounter (Signed)
  Follow up Call-  Call back number 01/25/2018  Post procedure Call Back phone  # [phone]  Permission to leave phone message Yes     Patient questions:  Message left to call if necessary

## 2018-02-14 DIAGNOSIS — L680 Hirsutism: Secondary | ICD-10-CM | POA: Diagnosis not present

## 2018-04-25 ENCOUNTER — Telehealth: Payer: Self-pay | Admitting: Gastroenterology

## 2018-04-25 NOTE — Telephone Encounter (Signed)
Spoke with pt and she states she has been having a discomfort on her left side under her ribs for about 4 weeks. Reports she has had a lot of bloating and cannot find any triggers. States Dr. Myrtie Neither mentioned she had some diverticulosis at time of colon and does not know if this is anything related to that. Pt scheduled to see Dr. Myrtie Neither 04/27/18@9 :45am. Pt aware of appt.

## 2018-04-27 ENCOUNTER — Other Ambulatory Visit (INDEPENDENT_AMBULATORY_CARE_PROVIDER_SITE_OTHER): Payer: Medicare Other

## 2018-04-27 ENCOUNTER — Ambulatory Visit (INDEPENDENT_AMBULATORY_CARE_PROVIDER_SITE_OTHER): Payer: Medicare Other | Admitting: Gastroenterology

## 2018-04-27 ENCOUNTER — Encounter: Payer: Self-pay | Admitting: Gastroenterology

## 2018-04-27 VITALS — BP 118/76 | HR 68 | Ht 63.0 in | Wt 187.5 lb

## 2018-04-27 DIAGNOSIS — R1012 Left upper quadrant pain: Secondary | ICD-10-CM | POA: Diagnosis not present

## 2018-04-27 DIAGNOSIS — R140 Abdominal distension (gaseous): Secondary | ICD-10-CM

## 2018-04-27 LAB — CBC WITH DIFFERENTIAL/PLATELET
Basophils Absolute: 0 10*3/uL (ref 0.0–0.1)
Basophils Relative: 0.5 % (ref 0.0–3.0)
EOS PCT: 0.9 % (ref 0.0–5.0)
Eosinophils Absolute: 0.1 10*3/uL (ref 0.0–0.7)
HCT: 43.6 % (ref 36.0–46.0)
Hemoglobin: 15.2 g/dL — ABNORMAL HIGH (ref 12.0–15.0)
Lymphocytes Relative: 24.8 % (ref 12.0–46.0)
Lymphs Abs: 1.5 10*3/uL (ref 0.7–4.0)
MCHC: 34.8 g/dL (ref 30.0–36.0)
MCV: 86.4 fl (ref 78.0–100.0)
Monocytes Absolute: 0.4 10*3/uL (ref 0.1–1.0)
Monocytes Relative: 7 % (ref 3.0–12.0)
Neutro Abs: 4.2 10*3/uL (ref 1.4–7.7)
Neutrophils Relative %: 66.8 % (ref 43.0–77.0)
Platelets: 297 10*3/uL (ref 150.0–400.0)
RBC: 5.05 Mil/uL (ref 3.87–5.11)
RDW: 13.2 % (ref 11.5–15.5)
WBC: 6.2 10*3/uL (ref 4.0–10.5)

## 2018-04-27 LAB — COMPREHENSIVE METABOLIC PANEL
ALBUMIN: 4.5 g/dL (ref 3.5–5.2)
ALK PHOS: 60 U/L (ref 39–117)
ALT: 22 U/L (ref 0–35)
AST: 21 U/L (ref 0–37)
BUN: 19 mg/dL (ref 6–23)
CALCIUM: 10 mg/dL (ref 8.4–10.5)
CHLORIDE: 103 meq/L (ref 96–112)
CO2: 28 mEq/L (ref 19–32)
CREATININE: 0.74 mg/dL (ref 0.40–1.20)
GFR: 83.08 mL/min (ref >60.00)
Glucose, Bld: 93 mg/dL (ref 70–99)
POTASSIUM: 3.8 meq/L (ref 3.5–5.1)
Sodium: 139 mEq/L (ref 135–145)
Total Bilirubin: 0.4 mg/dL (ref 0.2–1.2)
Total Protein: 7.4 g/dL (ref 6.0–8.3)

## 2018-04-27 LAB — H. PYLORI ANTIBODY, IGG: H Pylori IgG: NEGATIVE

## 2018-04-27 NOTE — Patient Instructions (Signed)
If you are age 68 or older, your body mass index should be between 23-30. Your Body mass index is 33.21 kg/m. If this is out of the aforementioned range listed, please consider follow up with your Primary Care Provider.  If you are age 46 or younger, your body mass index should be between 19-25. Your Body mass index is 33.21 kg/m. If this is out of the aformentioned range listed, please consider follow up with your Primary Care Provider.   Medication Samples have been provided to the patient.  Drug name: FDgard          Qty: 12  LOT: [id number]  Exp.Date: 08-2019  Dosing instructions: take 2 by mouth every morning   The patient has been instructed regarding the correct time, dose, and frequency of taking this medication, including desired effects and most common side effects.   Your provider has requested that you go to the basement level for lab work before leaving today. Press "B" on the elevator. The lab is located at the first door on the left as you exit the elevator.   It was a pleasure to see you today!  Dr. Myrtie Neither

## 2018-04-27 NOTE — Progress Notes (Signed)
[facility] Progress Note  Chief Complaint: Left upper quadrant pain  Subjective  History:  Dana Cobb asked to see me for several weeks of vague left upper quadrant discomfort that is there much of the time, dull as a pressure nonradiating with no clear triggers or relieving factors.  She feels bloated but denies nausea or vomiting.  Had no early satiety or change in diet, new medicines or other clear triggers her symptoms.  Her bowel habits are regular without rectal bleeding.  She had some concerns because her mother apparently died of bile duct cancer and had experienced some chronic upper abdominal pain prior to that. Recent routine colonoscopy for family history CRC - normal except redundant left colon and diverticulosis.  ROS: Cardiovascular:  no chest pain Respiratory: no dyspnea Status post hysterectomy (believes ovaries still in place).  Denies vaginal bleeding. Remainder of systems negative except as above  The patient's Past Medical, Family and Social History were reviewed and are on file in the EMR. She is originally from [place]  Objective:  Med list reviewed  Current Outpatient Medications:  .  atorvastatin (LIPITOR) 40 MG tablet, Take 1 tablet (40 mg total) by mouth daily., Disp: 90 tablet, Rfl: 3 .  CALCIUM PO, Take by mouth daily. , Disp: , Rfl:  .  chlorpheniramine (CHLOR-TRIMETON) 4 MG tablet, Take 4 mg by mouth 3 (three) times daily., Disp: , Rfl:  .  clindamycin (CLEOCIN T) 1 % lotion, Apply topically 2 (two) times daily as needed., Disp: , Rfl:  .  MAGNESIUM PO, Take by mouth daily. , Disp: , Rfl:  .  montelukast (SINGULAIR) 10 MG tablet, Take 1 tablet (10 mg total) by mouth daily., Disp: 90 tablet, Rfl: 3 .  Multiple Vitamins-Minerals (ICAPS AREDS FORMULA PO), Take by mouth., Disp: , Rfl:  .  omeprazole (PRILOSEC) 40 MG capsule, Take 1 capsule (40 mg total) by mouth as needed., Disp: 90 capsule, Rfl: 3 .  OVER THE COUNTER MEDICATION, Vitamin D 600 mg, One  capsule daily., Disp: , Rfl:  .  Psyllium (METAMUCIL PO), Take by mouth., Disp: , Rfl:    Vital signs in last 24 hrs: Vitals:   04/27/18 0922  BP: 118/76  Pulse: 68    Physical Exam  She is well-appearing  HEENT: sclera anicteric, oral mucosa moist without lesions  Neck: supple, no thyromegaly, JVD or lymphadenopathy  Cardiac: RRR without murmurs, S1S2 heard, no peripheral edema  Pulm: clear to auscultation bilaterally, normal RR and effort noted  Abdomen: soft, no tenderness, with active bowel sounds. No guarding or palpable hepatosplenomegaly.  Skin; warm and dry, no jaundice or rash  Recent Labs:  Last labs on file were normal CBC and CMP from a physical in June 2019   @ASSESSMENTPLANBEGIN @ Assessment: Encounter Diagnoses  Name Primary?  . LUQ pain Yes  . Abdominal bloating    Left upper quadrant discomfort that is difficult to characterize.  No escalation, vomiting or weight loss to suggest a likely harmful cause.  No focal tenderness on exam.   Plan: CBC, CMP, H. pylori antibody.  If normal, I am inclined to monitor this.  It is not clear if any medicines would be helpful for this.  We gave her some samples of FD gard in case a possible functional component. Imaging can be obtained if symptoms do not resolve, certainly if they escalate.  Total time 25 minutes, over half spent face-to-face with patient in counseling and coordination of care.   Dana Cobb  L Cobb III

## 2018-11-03 DIAGNOSIS — H2513 Age-related nuclear cataract, bilateral: Secondary | ICD-10-CM | POA: Diagnosis not present

## 2018-11-17 ENCOUNTER — Other Ambulatory Visit: Payer: Self-pay

## 2019-05-17 ENCOUNTER — Encounter: Payer: Self-pay | Admitting: Internal Medicine

## 2019-05-28 ENCOUNTER — Ambulatory Visit: Payer: Medicare Other | Attending: Internal Medicine

## 2019-05-28 DIAGNOSIS — Z23 Encounter for immunization: Secondary | ICD-10-CM

## 2019-05-28 NOTE — Progress Notes (Signed)
   Covid-19 Vaccination Clinic  Name:  Dana Cobb    MRN: [id number] DOB: 1950/05/23  05/28/2019  Dana Cobb was observed post Covid-19 immunization for 15 minutes without incidence. She was provided with Vaccine Information Sheet and instruction to access the V-Safe system.   Dana Cobb was instructed to call 911 with any severe reactions post vaccine: Marland Kitchen Difficulty breathing  . Swelling of your face and throat  . A fast heartbeat  . A bad rash all over your body  . Dizziness and weakness    Immunizations Administered    Name Date Dose VIS Date Route   Pfizer COVID-19 Vaccine 05/28/2019  2:41 PM 0.3 mL 03/31/2019 Intramuscular   Manufacturer: [employer], [employer]   Lot: [id number]   NDC: [address]-5

## 2019-06-12 ENCOUNTER — Ambulatory Visit: Payer: Medicare Other

## 2019-06-22 ENCOUNTER — Ambulatory Visit: Payer: Medicare Other | Attending: Internal Medicine

## 2019-06-22 DIAGNOSIS — Z23 Encounter for immunization: Secondary | ICD-10-CM | POA: Insufficient documentation

## 2019-06-22 NOTE — Progress Notes (Signed)
   Covid-19 Vaccination Clinic  Name:  Dana Cobb    MRN: [id number] DOB: September 21, 1950  06/22/2019  Dana Cobb was observed post Covid-19 immunization for 15 minutes without incident. She was provided with Vaccine Information Sheet and instruction to access the V-Safe system.   Dana Cobb was instructed to call 911 with any severe reactions post vaccine: Marland Kitchen Difficulty breathing  . Swelling of face and throat  . A fast heartbeat  . A bad rash all over body  . Dizziness and weakness   Immunizations Administered    Name Date Dose VIS Date Route   Pfizer COVID-19 Vaccine 06/22/2019  4:08 PM 0.3 mL 03/31/2019 Intramuscular   Manufacturer: [employer], [employer]   Lot: [id number]   NDC: [address]-7

## 2019-07-06 DIAGNOSIS — H2513 Age-related nuclear cataract, bilateral: Secondary | ICD-10-CM | POA: Diagnosis not present

## 2019-07-26 DIAGNOSIS — Z01818 Encounter for other preprocedural examination: Secondary | ICD-10-CM | POA: Diagnosis not present

## 2019-07-26 DIAGNOSIS — H25812 Combined forms of age-related cataract, left eye: Secondary | ICD-10-CM | POA: Diagnosis not present

## 2019-07-26 DIAGNOSIS — H52222 Regular astigmatism, left eye: Secondary | ICD-10-CM | POA: Diagnosis not present

## 2019-08-07 DIAGNOSIS — H2512 Age-related nuclear cataract, left eye: Secondary | ICD-10-CM | POA: Diagnosis not present

## 2019-08-07 DIAGNOSIS — H25812 Combined forms of age-related cataract, left eye: Secondary | ICD-10-CM | POA: Diagnosis not present

## 2019-08-14 DIAGNOSIS — H2512 Age-related nuclear cataract, left eye: Secondary | ICD-10-CM | POA: Diagnosis not present

## 2019-08-21 DIAGNOSIS — H25811 Combined forms of age-related cataract, right eye: Secondary | ICD-10-CM | POA: Diagnosis not present

## 2019-08-21 DIAGNOSIS — H2511 Age-related nuclear cataract, right eye: Secondary | ICD-10-CM | POA: Diagnosis not present

## 2019-09-07 ENCOUNTER — Telehealth (INDEPENDENT_AMBULATORY_CARE_PROVIDER_SITE_OTHER): Payer: Medicare Other | Admitting: Internal Medicine

## 2019-09-07 DIAGNOSIS — K219 Gastro-esophageal reflux disease without esophagitis: Secondary | ICD-10-CM

## 2019-09-07 DIAGNOSIS — J019 Acute sinusitis, unspecified: Secondary | ICD-10-CM

## 2019-09-07 DIAGNOSIS — J309 Allergic rhinitis, unspecified: Secondary | ICD-10-CM | POA: Diagnosis not present

## 2019-09-07 MED ORDER — PROMETHAZINE-DM 6.25-15 MG/5ML PO SYRP: 5.0000 mL | ORAL_SOLUTION | Freq: Four times a day (QID) | ORAL | 1 refills | Status: DC | PRN

## 2019-09-07 MED ORDER — PREDNISONE 10 MG PO TABS: ORAL_TABLET | ORAL | 0 refills | Status: DC

## 2019-09-07 MED ORDER — AZITHROMYCIN 250 MG PO TABS: ORAL_TABLET | ORAL | 1 refills | Status: DC

## 2019-09-07 NOTE — Progress Notes (Signed)
Patient ID: Dana Cobb, female   DOB: Aug 11, 1950, 69 y.o.   MRN: [id number]  Virtual Visit via Video Note  I connected with Venora Maples on 09/07/19 at  2:40 PM EDT by a video enabled telemedicine application and verified that I am speaking with the correct person using two identifiers.  Location: of all participants today Patient: at home Provider: at office   I discussed the limitations of evaluation and management by telemedicine and the availability of in person appointments. The patient expressed understanding and agreed to proceed.  History of Present Illness:  Here with 2-3 days acute onset fever, facial pain, pressure, headache, general weakness and malaise, and greenish d/c, with mild ST and cough, but pt denies chest pain, wheezing, increased sob or doe, orthopnea, PND, increased LE swelling, palpitations, dizziness or syncope.  Does have several wks ongoing nasal allergy symptoms with clearish congestion, itch and sneezing, without fever, pain, ST, cough, swelling or wheezing.  Denies worsening reflux, abd pain, dysphagia, n/v, bowel change or blood.  Past Medical History:  Diagnosis Date  . Allergic rhinitis   . Allergy   . Cataract   . Colon polyps 09/24/2017  . GERD (gastroesophageal reflux disease) 09/24/2017  . Hyperlipidemia   . Osteopenia 09/24/2017   Past Surgical History:  Procedure Laterality Date  . BUNIONECTOMY Left   . Gartner duct cyst    . HAMMER TOE SURGERY Left   . TONSILLECTOMY AND ADENOIDECTOMY    . VAGINAL HYSTERECTOMY    . WISDOM TOOTH EXTRACTION      reports that she has never smoked. She has never used smokeless tobacco. She reports current alcohol use. She reports that she does not use drugs. family history includes Aneurysm in her maternal grandfather; Cancer in her maternal grandmother; Colon cancer in her mother; Heart disease in her father and mother; Hyperlipidemia in her father; Stroke in her maternal grandmother and mother. Allergies   Allergen Reactions  . Codeine Anaphylaxis   Current Outpatient Medications on File Prior to Visit  Medication Sig Dispense Refill  . atorvastatin (LIPITOR) 40 MG tablet Take 1 tablet (40 mg total) by mouth daily. 90 tablet 3  . CALCIUM PO Take by mouth daily.     . chlorpheniramine (CHLOR-TRIMETON) 4 MG tablet Take 4 mg by mouth 3 (three) times daily.    . clindamycin (CLEOCIN T) 1 % lotion Apply topically 2 (two) times daily as needed.    Marland Kitchen MAGNESIUM PO Take by mouth daily.     . montelukast (SINGULAIR) 10 MG tablet Take 1 tablet (10 mg total) by mouth daily. 90 tablet 3  . Multiple Vitamins-Minerals (ICAPS AREDS FORMULA PO) Take by mouth.    Marland Kitchen omeprazole (PRILOSEC) 40 MG capsule Take 1 capsule (40 mg total) by mouth as needed. 90 capsule 3  . OVER THE COUNTER MEDICATION Vitamin D 600 mg, One capsule daily.    . Psyllium (METAMUCIL PO) Take by mouth.     No current facility-administered medications on file prior to visit.    Observations/Objective: Alert, NAD, appropriate mood and affect, resps normal, cn 2-12 intact, moves all 4s, no visible rash or swelling Lab Results  Component Value Date   WBC 6.2 04/27/2018   HGB 15.2 (H) 04/27/2018   HCT 43.6 04/27/2018   PLT 297.0 04/27/2018   GLUCOSE 93 04/27/2018   CHOL 263 (H) 09/24/2017   TRIG 111.0 09/24/2017   HDL 51.30 09/24/2017   LDLCALC 189 (H) 09/24/2017   ALT 22 04/27/2018  AST 21 04/27/2018   NA 139 04/27/2018   K 3.8 04/27/2018   CL 103 04/27/2018   CREATININE 0.74 04/27/2018   BUN 19 04/27/2018   CO2 28 04/27/2018   TSH 2.35 09/24/2017   Assessment and Plan: See  notes  Follow Up Instructions: Seen otes   I discussed the assessment and treatment plan with the patient. The patient was provided an opportunity to ask questions and all were answered. The patient agreed with the plan and demonstrated an understanding of the instructions.   The patient was advised to call back or seek an in-person evaluation if  the symptoms worsen or if the condition fails to improve as anticipated.   Cathlean Cower, MD

## 2019-09-09 ENCOUNTER — Encounter: Payer: Self-pay | Admitting: Internal Medicine

## 2019-09-09 NOTE — Assessment & Plan Note (Addendum)
Mild to mod, for antibx course, cough med prn,  to f/u any worsening symptoms or concerns  I spent 31 minutes in preparing to see the patient by review of recent labs, imaging and procedures, obtaining and reviewing separately obtained history, communicating with the patient and family or caregiver, ordering medications, tests or procedures, and documenting clinical information in the EHR including the differential Dx, treatment, and any further evaluation and other management of sinus infection allergies, gerd

## 2019-09-09 NOTE — Assessment & Plan Note (Signed)
stable overall by history and exam, recent data reviewed with pt, and pt to continue medical treatment as before,  to f/u any worsening symptoms or concerns  

## 2019-09-09 NOTE — Assessment & Plan Note (Signed)
For restart CTM and mucinex prn, and nasacort asd

## 2019-09-09 NOTE — Patient Instructions (Signed)
Please take all new medication as prescribed 

## 2019-10-06 DIAGNOSIS — Z20822 Contact with and (suspected) exposure to covid-19: Secondary | ICD-10-CM | POA: Diagnosis not present

## 2019-11-17 DIAGNOSIS — Z20822 Contact with and (suspected) exposure to covid-19: Secondary | ICD-10-CM | POA: Diagnosis not present

## 2019-12-30 DIAGNOSIS — Z20822 Contact with and (suspected) exposure to covid-19: Secondary | ICD-10-CM | POA: Diagnosis not present

## 2020-01-15 ENCOUNTER — Ambulatory Visit (INDEPENDENT_AMBULATORY_CARE_PROVIDER_SITE_OTHER): Payer: Medicare Other | Admitting: Psychology

## 2020-01-15 DIAGNOSIS — F419 Anxiety disorder, unspecified: Secondary | ICD-10-CM

## 2020-01-29 ENCOUNTER — Ambulatory Visit (INDEPENDENT_AMBULATORY_CARE_PROVIDER_SITE_OTHER): Payer: Medicare Other | Admitting: Psychology

## 2020-01-29 DIAGNOSIS — F419 Anxiety disorder, unspecified: Secondary | ICD-10-CM | POA: Diagnosis not present

## 2020-02-06 ENCOUNTER — Ambulatory Visit: Payer: Medicare Other | Attending: Internal Medicine

## 2020-02-06 DIAGNOSIS — Z23 Encounter for immunization: Secondary | ICD-10-CM

## 2020-02-06 NOTE — Progress Notes (Signed)
   Covid-19 Vaccination Clinic  Name:  Dana Cobb    MRN: [id number] DOB: May 21, 1950  02/06/2020  [name] was observed post Covid-19 immunization for 15 minutes without incident. She was provided with Vaccine Information Sheet and instruction to access the V-Safe system.   Ms. Labrum was instructed to call 911 with any severe reactions post vaccine: Marland Kitchen Difficulty breathing  . Swelling of face and throat  . A fast heartbeat  . A bad rash all over body  . Dizziness and weakness

## 2020-02-12 ENCOUNTER — Ambulatory Visit: Payer: Medicare Other | Admitting: Psychology

## 2020-03-01 ENCOUNTER — Ambulatory Visit (INDEPENDENT_AMBULATORY_CARE_PROVIDER_SITE_OTHER): Payer: Medicare Other

## 2020-03-01 DIAGNOSIS — Z0000 Encounter for general adult medical examination without abnormal findings: Secondary | ICD-10-CM

## 2020-03-01 NOTE — Progress Notes (Signed)
I connected with Dana Cobb today by telephone and verified that I am speaking with the correct person using two identifiers. Location patient: home Location provider: work Persons participating in the virtual visit: Dana Cobb and [employer], LPN.   I discussed the limitations, risks, security and privacy concerns of performing an evaluation and management service by telephone and the availability of in person appointments. I also discussed with the patient that there may be a patient responsible charge related to this service. The patient expressed understanding and verbally consented to this telephonic visit.    Interactive audio and video telecommunications were attempted between this provider and patient, however failed, due to patient having technical difficulties OR patient did not have access to video capability.  We continued and completed visit with audio only.  Some vital signs may be absent or patient reported.   Time Spent with patient on telephone encounter: 36 minutes  Subjective:   Dana Cobb is a 69 y.o. female who presents for an Initial Medicare Annual Wellness Visit.  Review of Systems    No ROS. Medicare Wellness Visit. Cardiac Risk Factors include: advanced age (>51mn, >>43women);dyslipidemia;family history of premature cardiovascular disease     Objective:    There were no vitals filed for this visit. There is no height or weight on file to calculate BMI.  Advanced Directives 03/01/2020  Does Patient Have a Medical Advance Directive? No  Would patient like information on creating a medical advance directive? No - Patient declined    Current Medications (verified) Outpatient Encounter Medications as of 03/01/2020  Medication Sig  . CALCIUM PO Take by mouth daily.   .Marland KitchenMAGNESIUM PO Take by mouth daily.   . Multiple Vitamins-Minerals (ICAPS AREDS FORMULA PO) Take by mouth.  .Marland Kitchenomeprazole (PRILOSEC) 40 MG capsule Take 1 capsule  (40 mg total) by mouth as needed.  .Marland KitchenOVER THE COUNTER MEDICATION Vitamin D 600 mg, One capsule daily.  . Psyllium (METAMUCIL PO) Take by mouth.  .Marland Kitchenatorvastatin (LIPITOR) 40 MG tablet Take 1 tablet (40 mg total) by mouth daily.  .Marland Kitchenazithromycin (ZITHROMAX) 250 MG tablet 2 tab by mouth day 1, then 1 per day  . chlorpheniramine (CHLOR-TRIMETON) 4 MG tablet Take 4 mg by mouth 3 (three) times daily. (Patient not taking: Reported on 03/01/2020)  . clindamycin (CLEOCIN T) 1 % lotion Apply topically 2 (two) times daily as needed. (Patient not taking: Reported on 03/01/2020)  . montelukast (SINGULAIR) 10 MG tablet Take 1 tablet (10 mg total) by mouth daily.  . predniSONE (DELTASONE) 10 MG tablet 2 tabs by mouth per day for 5 days  . promethazine-dextromethorphan (PROMETHAZINE-DM) 6.25-15 MG/5ML syrup Take 5 mLs by mouth 4 (four) times daily as needed for cough.   No facility-administered encounter medications on file as of 03/01/2020.    Allergies (verified) Codeine   History: Past Medical History:  Diagnosis Date  . Allergic rhinitis   . Allergy   . Cataract   . Colon polyps 09/24/2017  . GERD (gastroesophageal reflux disease) 09/24/2017  . Hyperlipidemia   . Osteopenia 09/24/2017   Past Surgical History:  Procedure Laterality Date  . BUNIONECTOMY Left   . Gartner duct cyst    . HAMMER TOE SURGERY Left   . TONSILLECTOMY AND ADENOIDECTOMY    . VAGINAL HYSTERECTOMY    . WISDOM TOOTH EXTRACTION     Family History  Problem Relation Age of Onset  . Heart disease Mother   . Stroke Mother   .  Colon cancer Mother   . Heart disease Father   . Hyperlipidemia Father   . Stroke Maternal Grandmother   . Cancer Maternal Grandmother   . Aneurysm Maternal Grandfather   . Esophageal cancer Neg Hx   . Rectal cancer Neg Hx   . Stomach cancer Neg Hx    Social History   Socioeconomic History  . Marital status: Divorced    Spouse name: Not on file  . Number of children: Not on file  . Years of  education: Not on file  . Highest education level: Not on file  Occupational History  . Not on file  Tobacco Use  . Smoking status: Never Smoker  . Smokeless tobacco: Never Used  Vaping Use  . Vaping Use: Never used  Substance and Sexual Activity  . Alcohol use: Yes    Comment: Occasional wine  . Drug use: No  . Sexual activity: Not on file  Other Topics Concern  . Not on file  Social History Narrative  . Not on file   Social Determinants of Health   Financial Resource Strain: Low Risk   . Difficulty of Paying Living Expenses: Not hard at all  Food Insecurity: No Food Insecurity  . Worried About Charity fundraiser in the Last Year: Never true  . Ran Out of Food in the Last Year: Never true  Transportation Needs: No Transportation Needs  . Lack of Transportation (Medical): No  . Lack of Transportation (Non-Medical): No  Physical Activity: Inactive  . Days of Exercise per Week: 0 days  . Minutes of Exercise per Session: 0 min  Stress: No Stress Concern Present  . Feeling of Stress : Not at all  Social Connections: Socially Isolated  . Frequency of Communication with Friends and Family: More than three times a week  . Frequency of Social Gatherings with Friends and Family: Twice a week  . Attends Religious Services: Never  . Active Member of Clubs or Organizations: No  . Attends Archivist Meetings: Never  . Marital Status: Widowed    Tobacco Counseling Counseling given: Not Answered   Clinical Intake:  Pre-visit preparation completed: Yes  Pain : No/denies pain     Nutritional Risks: None Diabetes: No  How often do you need to have someone help you when you read instructions, pamphlets, or other written materials from your doctor or pharmacy?: 1 - Never What is the last grade level you completed in school?: Associates Degree  Diabetic? no  Interpreter Needed?: No  Information entered by :: Dana Abu, LPN   Activities of Daily  Living In your present state of health, do you have any difficulty performing the following activities: 03/01/2020  Hearing? N  Vision? N  Difficulty concentrating or making decisions? N  Walking or climbing stairs? N  Dressing or bathing? N  Doing errands, shopping? N  Preparing Food and eating ? N  Using the Toilet? N  In the past six months, have you accidently leaked urine? N  Do you have problems with loss of bowel control? N  Managing your Medications? N  Managing your Finances? N  Housekeeping or managing your Housekeeping? N  Some recent data might be hidden    Patient Care Team: Biagio Borg, MD as PCP - General (Internal Medicine)  Indicate any recent Medical Services you may have received from other than [facility] providers in the past year (date may be approximate).     Assessment:   This is a  routine wellness examination for Dana Cobb.  Hearing/Vision screen No exam data present  Dietary issues and exercise activities discussed: Current Exercise Habits: The patient does not participate in regular exercise at present, Exercise limited by: None identified  Goals    .  Patient Stated (pt-stated)      To maintain my current health status by continuing to eat healthy, stay physically active and socially active.      Depression Screen PHQ 2/9 Scores 03/01/2020 03/01/2020 09/24/2017  PHQ - 2 Score 0 0 0    Fall Risk Fall Risk  03/01/2020 11/17/2018 09/24/2017  Falls in the past year? 0 0 No  Comment - Emmi Telephone Survey: data to providers prior to load -  Number falls in past yr: 0 - -  Injury with Fall? 0 - -  Risk for fall due to : No Fall Risks - -  Follow up Falls evaluation completed - -    Any stairs in or around the home? Yes  If so, are there any without handrails? No  Home free of loose throw rugs in walkways, pet beds, electrical cords, etc? Yes  Adequate lighting in your home to reduce risk of falls? Yes   ASSISTIVE DEVICES UTILIZED TO PREVENT  FALLS:  Life alert? No  Use of a cane, walker or w/c? No  Grab bars in the bathroom? No  Shower chair or bench in shower? No  Elevated toilet seat or a handicapped toilet? No   TIMED UP AND GO:  Was the test performed? No .  Length of time to ambulate 10 feet: 0 sec.   Gait steady and fast without use of assistive device  Cognitive Function: Patient is cogitatively intact.        Immunizations Immunization History  Administered Date(s) Administered  . DT (Pediatric) 09/06/1967, 11/11/1981  . IPV 09/06/1967  . MMR 12/11/1969  . PFIZER SARS-COV-2 Vaccination 05/28/2019, 06/22/2019, 02/06/2020    TDAP status: Due, Education has been provided regarding the importance of this vaccine. Advised may receive this vaccine at local pharmacy or Health Dept. Aware to provide a copy of the vaccination record if obtained from local pharmacy or Health Dept. Verbalized acceptance and understanding. Flu Vaccine status: Declined, Education has been provided regarding the importance of this vaccine but patient still declined. Advised may receive this vaccine at local pharmacy or Health Dept. Aware to provide a copy of the vaccination record if obtained from local pharmacy or Health Dept. Verbalized acceptance and understanding. Pneumococcal vaccine status: Declined,  Education has been provided regarding the importance of this vaccine but patient still declined. Advised may receive this vaccine at local pharmacy or Health Dept. Aware to provide a copy of the vaccination record if obtained from local pharmacy or Health Dept. Verbalized acceptance and understanding.  Covid-19 vaccine status: Declined, Education has been provided regarding the importance of this vaccine but patient still declined. Advised may receive this vaccine at local pharmacy or Health Dept.or vaccine clinic. Aware to provide a copy of the vaccination record if obtained from local pharmacy or Health Dept. Verbalized acceptance and  understanding.  Qualifies for Shingles Vaccine? Yes   Zostavax completed No   Shingrix Completed?: No.    Education has been provided regarding the importance of this vaccine. Patient has been advised to call insurance company to determine out of pocket expense if they have not yet received this vaccine. Advised may also receive vaccine at local pharmacy or Health Dept. Verbalized acceptance and understanding.  Screening  Tests Health Maintenance  Topic Date Due  . TETANUS/TDAP  Never done  . PNA vac Low Risk Adult (1 of 2 - PCV13) Never done  . MAMMOGRAM  11/19/2018  . INFLUENZA VACCINE  Never done  . COLONOSCOPY  01/26/2023  . DEXA SCAN  Completed  . COVID-19 Vaccine  Completed  . Hepatitis C Screening  Completed    Health Maintenance  Health Maintenance Due  Topic Date Due  . TETANUS/TDAP  Never done  . PNA vac Low Risk Adult (1 of 2 - PCV13) Never done  . MAMMOGRAM  11/19/2018  . INFLUENZA VACCINE  Never done    Colorectal cancer screening: Completed 01/25/2018. Repeat every 5 years Mammogram status: Completed 11/18/2016. Repeat every year Bone Density status: Completed 11/18/2016. Results reflect: Bone density results: OSTEOPENIA. Repeat every 3 years.  Lung Cancer Screening: (Low Dose CT Chest recommended if Age 67-80 years, 30 pack-year currently smoking OR have quit w/in 15years.) does not qualify.   Lung Cancer Screening Referral: no  Additional Screening:  Hepatitis C Screening: does qualify; Completed: yes  Vision Screening: Recommended annual ophthalmology exams for early detection of glaucoma and other disorders of the eye. Is the patient up to date with their annual eye exam?  Yes  Who is the provider or what is the name of the office in which the patient attends annual eye exams? [facility] If pt is not established with a provider, would they like to be referred to a provider to establish care? No .   Dental Screening: Recommended annual dental  exams for proper oral hygiene  Community Resource Referral / Chronic Care Management: CRR required this visit?  No   CCM required this visit?  No      Plan:     I have personally reviewed and noted the following in the patient's chart:   . Medical and social history . Use of alcohol, tobacco or illicit drugs  . Current medications and supplements . Functional ability and status . Nutritional status . Physical activity . Advanced directives . List of other physicians . Hospitalizations, surgeries, and ER visits in previous 12 months . Vitals . Screenings to include cognitive, depression, and falls . Referrals and appointments  In addition, I have reviewed and discussed with patient certain preventive protocols, quality metrics, and best practice recommendations. A written personalized care plan for preventive services as well as general preventive health recommendations were provided to patient.     Sheral Flow, LPN   16/01/9603   Nurse Notes:  Patient is cogitatively intact. There were no vitals filed for this visit. There is no height or weight on file to calculate BMI. Patient stated that she has no issues with gait or balance; does not use any assistive devices.

## 2020-03-01 NOTE — Patient Instructions (Addendum)
Dana Cobb , Thank you for taking time to come for your Medicare Wellness Visit. I appreciate your ongoing commitment to your health goals. Please review the following plan we discussed and let me know if I can assist you in the future.   Screening recommendations/referrals: Colonoscopy: 01/25/2018; due every 5 years Mammogram: 11/18/2016 Bone Density: 11/18/2016; due every 3 years Recommended yearly ophthalmology/optometry visit for glaucoma screening and checkup Recommended yearly dental visit for hygiene and checkup  Vaccinations: Influenza vaccine: never done Pneumococcal vaccine: never done Tdap vaccine: never done Shingles vaccine: never done   Covid-19: up to date with booster  Advanced directives: Advance directive discussed with you today. Even though you declined this today please call our office should you change your mind and we can give you the proper paperwork for you to fill out.  Conditions/risks identified: Yes; Reviewed health maintenance screenings with patient today and relevant education, vaccines, and/or referrals were provided. Please continue to do your personal lifestyle choices by: daily care of teeth and gums, regular physical activity (goal should be 5 days a week for 30 minutes), eat a healthy diet, avoid tobacco and drug use, limiting any alcohol intake, taking a low-dose aspirin (if not allergic or have been advised by your provider otherwise) and taking vitamins and minerals as recommended by your provider. Continue doing brain stimulating activities (puzzles, reading, adult coloring books, staying active) to keep memory sharp. Continue to eat heart healthy diet (full of fruits, vegetables, whole grains, lean protein, water--limit salt, fat, and sugar intake) and increase physical activity as tolerated.  Next appointment: Please schedule your next Medicare Wellness Visit with your Nurse Health Advisor in 1 year by calling [phone].  Preventive Care 10 Years and  Older, Female Preventive care refers to lifestyle choices and visits with your health care provider that can promote health and wellness. What does preventive care include?  A yearly physical exam. This is also called an annual well check.  Dental exams once or twice a year.  Routine eye exams. Ask your health care provider how often you should have your eyes checked.  Personal lifestyle choices, including:  Daily care of your teeth and gums.  Regular physical activity.  Eating a healthy diet.  Avoiding tobacco and drug use.  Limiting alcohol use.  Practicing safe sex.  Taking low-dose aspirin every day.  Taking vitamin and mineral supplements as recommended by your health care provider. What happens during an annual well check? The services and screenings done by your health care provider during your annual well check will depend on your age, overall health, lifestyle risk factors, and family history of disease. Counseling  Your health care provider may ask you questions about your:  Alcohol use.  Tobacco use.  Drug use.  Emotional well-being.  Home and relationship well-being.  Sexual activity.  Eating habits.  History of falls.  Memory and ability to understand (cognition).  Work and work Astronomer.  Reproductive health. Screening  You may have the following tests or measurements:  Height, weight, and BMI.  Blood pressure.  Lipid and cholesterol levels. These may be checked every 5 years, or more frequently if you are over 22 years old.  Skin check.  Lung cancer screening. You may have this screening every year starting at age 49 if you have a 30-pack-year history of smoking and currently smoke or have quit within the past 15 years.  Fecal occult blood test (FOBT) of the stool. You may have this test every year  starting at age 59.  Flexible sigmoidoscopy or colonoscopy. You may have a sigmoidoscopy every 5 years or a colonoscopy every 10 years  starting at age 9.  Hepatitis C blood test.  Hepatitis B blood test.  Sexually transmitted disease (STD) testing.  Diabetes screening. This is done by checking your blood sugar (glucose) after you have not eaten for a while (fasting). You may have this done every 1-3 years.  Bone density scan. This is done to screen for osteoporosis. You may have this done starting at age 61.  Mammogram. This may be done every 1-2 years. Talk to your health care provider about how often you should have regular mammograms. Talk with your health care provider about your test results, treatment options, and if necessary, the need for more tests. Vaccines  Your health care provider may recommend certain vaccines, such as:  Influenza vaccine. This is recommended every year.  Tetanus, diphtheria, and acellular pertussis (Tdap, Td) vaccine. You may need a Td booster every 10 years.  Zoster vaccine. You may need this after age 30.  Pneumococcal 13-valent conjugate (PCV13) vaccine. One dose is recommended after age 60.  Pneumococcal polysaccharide (PPSV23) vaccine. One dose is recommended after age 30. Talk to your health care provider about which screenings and vaccines you need and how often you need them. This information is not intended to replace advice given to you by your health care provider. Make sure you discuss any questions you have with your health care provider. Document Released: 05/03/2015 Document Revised: 12/25/2015 Document Reviewed: 02/05/2015 Elsevier Interactive Patient Education  2017 [place] Prevention in the Home Falls can cause injuries. They can happen to people of all ages. There are many things you can do to make your home safe and to help prevent falls. What can I do on the outside of my home?  Regularly fix the edges of walkways and driveways and fix any cracks.  Remove anything that might make you trip as you walk through a door, such as a raised step or  threshold.  Trim any bushes or trees on the path to your home.  Use bright outdoor lighting.  Clear any walking paths of anything that might make someone trip, such as rocks or tools.  Regularly check to see if handrails are loose or broken. Make sure that both sides of any steps have handrails.  Any raised decks and porches should have guardrails on the edges.  Have any leaves, snow, or ice cleared regularly.  Use sand or salt on walking paths during winter.  Clean up any spills in your garage right away. This includes oil or grease spills. What can I do in the bathroom?  Use night lights.  Install grab bars by the toilet and in the tub and shower. Do not use towel bars as grab bars.  Use non-skid mats or decals in the tub or shower.  If you need to sit down in the shower, use a plastic, non-slip stool.  Keep the floor dry. Clean up any water that spills on the floor as soon as it happens.  Remove soap buildup in the tub or shower regularly.  Attach bath mats securely with double-sided non-slip rug tape.  Do not have throw rugs and other things on the floor that can make you trip. What can I do in the bedroom?  Use night lights.  Make sure that you have a light by your bed that is easy to reach.  Do not  use any sheets or blankets that are too big for your bed. They should not hang down onto the floor.  Have a firm chair that has side arms. You can use this for support while you get dressed.  Do not have throw rugs and other things on the floor that can make you trip. What can I do in the kitchen?  Clean up any spills right away.  Avoid walking on wet floors.  Keep items that you use a lot in easy-to-reach places.  If you need to reach something above you, use a strong step stool that has a grab bar.  Keep electrical cords out of the way.  Do not use floor polish or wax that makes floors slippery. If you must use wax, use non-skid floor wax.  Do not have  throw rugs and other things on the floor that can make you trip. What can I do with my stairs?  Do not leave any items on the stairs.  Make sure that there are handrails on both sides of the stairs and use them. Fix handrails that are broken or loose. Make sure that handrails are as long as the stairways.  Check any carpeting to make sure that it is firmly attached to the stairs. Fix any carpet that is loose or worn.  Avoid having throw rugs at the top or bottom of the stairs. If you do have throw rugs, attach them to the floor with carpet tape.  Make sure that you have a light switch at the top of the stairs and the bottom of the stairs. If you do not have them, ask someone to add them for you. What else can I do to help prevent falls?  Wear shoes that:  Do not have high heels.  Have rubber bottoms.  Are comfortable and fit you well.  Are closed at the toe. Do not wear sandals.  If you use a stepladder:  Make sure that it is fully opened. Do not climb a closed stepladder.  Make sure that both sides of the stepladder are locked into place.  Ask someone to hold it for you, if possible.  Clearly Shakeem and make sure that you can see:  Any grab bars or handrails.  First and last steps.  Where the edge of each step is.  Use tools that help you move around (mobility aids) if they are needed. These include:  Canes.  Walkers.  Scooters.  Crutches.  Turn on the lights when you go into a dark area. Replace any light bulbs as soon as they burn out.  Set up your furniture so you have a clear path. Avoid moving your furniture around.  If any of your floors are uneven, fix them.  If there are any pets around you, be aware of where they are.  Review your medicines with your doctor. Some medicines can make you feel dizzy. This can increase your chance of falling. Ask your doctor what other things that you can do to help prevent falls. This information is not intended to  replace advice given to you by your health care provider. Make sure you discuss any questions you have with your health care provider. Document Released: 01/31/2009 Document Revised: 09/12/2015 Document Reviewed: 05/11/2014 Elsevier Interactive Patient Education  2017 [employer].

## 2020-03-04 ENCOUNTER — Other Ambulatory Visit: Payer: Self-pay

## 2020-03-05 ENCOUNTER — Encounter: Payer: Self-pay | Admitting: Internal Medicine

## 2020-03-05 ENCOUNTER — Ambulatory Visit (INDEPENDENT_AMBULATORY_CARE_PROVIDER_SITE_OTHER): Payer: Medicare Other | Admitting: Internal Medicine

## 2020-03-05 VITALS — BP 130/90 | HR 87 | Temp 98.7°F | Ht 63.0 in | Wt 179.0 lb

## 2020-03-05 DIAGNOSIS — E7849 Other hyperlipidemia: Secondary | ICD-10-CM

## 2020-03-05 DIAGNOSIS — Z1231 Encounter for screening mammogram for malignant neoplasm of breast: Secondary | ICD-10-CM

## 2020-03-05 DIAGNOSIS — J309 Allergic rhinitis, unspecified: Secondary | ICD-10-CM | POA: Diagnosis not present

## 2020-03-05 DIAGNOSIS — E538 Deficiency of other specified B group vitamins: Secondary | ICD-10-CM

## 2020-03-05 DIAGNOSIS — E559 Vitamin D deficiency, unspecified: Secondary | ICD-10-CM | POA: Diagnosis not present

## 2020-03-05 DIAGNOSIS — M8580 Other specified disorders of bone density and structure, unspecified site: Secondary | ICD-10-CM | POA: Diagnosis not present

## 2020-03-05 DIAGNOSIS — Z23 Encounter for immunization: Secondary | ICD-10-CM

## 2020-03-05 LAB — LIPID PANEL
Cholesterol: 301 mg/dL — ABNORMAL HIGH (ref 0–200)
HDL: 61.4 mg/dL (ref >39.00)
LDL Cholesterol: 215 mg/dL — ABNORMAL HIGH (ref 0–99)
NonHDL: 239.44
Total CHOL/HDL Ratio: 5
Triglycerides: 122 mg/dL (ref 0.0–149.0)
VLDL: 24.4 mg/dL (ref 0.0–40.0)

## 2020-03-05 LAB — BASIC METABOLIC PANEL
BUN: 15 mg/dL (ref 6–23)
CO2: 26 mEq/L (ref 19–32)
Calcium: 9.9 mg/dL (ref 8.4–10.5)
Chloride: 102 mEq/L (ref 96–112)
Creatinine, Ser: 0.71 mg/dL (ref 0.40–1.20)
GFR: 86.81 mL/min (ref >60.00)
Glucose, Bld: 93 mg/dL (ref 70–99)
Potassium: 4.2 mEq/L (ref 3.5–5.1)
Sodium: 138 mEq/L (ref 135–145)

## 2020-03-05 LAB — CBC WITH DIFFERENTIAL/PLATELET
Basophils Absolute: 0.1 10*3/uL (ref 0.0–0.1)
Basophils Relative: 0.9 % (ref 0.0–3.0)
Eosinophils Absolute: 0.1 10*3/uL (ref 0.0–0.7)
Eosinophils Relative: 0.7 % (ref 0.0–5.0)
HCT: 43.6 % (ref 36.0–46.0)
Hemoglobin: 15.2 g/dL — ABNORMAL HIGH (ref 12.0–15.0)
Lymphocytes Relative: 19 % (ref 12.0–46.0)
Lymphs Abs: 1.4 10*3/uL (ref 0.7–4.0)
MCHC: 34.8 g/dL (ref 30.0–36.0)
MCV: 86.7 fl (ref 78.0–100.0)
Monocytes Absolute: 0.5 10*3/uL (ref 0.1–1.0)
Monocytes Relative: 6.8 % (ref 3.0–12.0)
Neutro Abs: 5.3 10*3/uL (ref 1.4–7.7)
Neutrophils Relative %: 72.6 % (ref 43.0–77.0)
Platelets: 276 10*3/uL (ref 150.0–400.0)
RBC: 5.03 Mil/uL (ref 3.87–5.11)
RDW: 13.5 % (ref 11.5–15.5)
WBC: 7.3 10*3/uL (ref 4.0–10.5)

## 2020-03-05 LAB — HEPATIC FUNCTION PANEL
ALT: 20 U/L (ref 0–35)
AST: 21 U/L (ref 0–37)
Albumin: 4.7 g/dL (ref 3.5–5.2)
Alkaline Phosphatase: 63 U/L (ref 39–117)
Bilirubin, Direct: 0.1 mg/dL (ref 0.0–0.3)
Total Bilirubin: 0.5 mg/dL (ref 0.2–1.2)
Total Protein: 7.7 g/dL (ref 6.0–8.3)

## 2020-03-05 LAB — VITAMIN D 25 HYDROXY (VIT D DEFICIENCY, FRACTURES): VITD: 27.77 ng/mL — ABNORMAL LOW (ref 30.00–100.00)

## 2020-03-05 LAB — TSH: TSH: 2.06 u[IU]/mL (ref 0.35–4.50)

## 2020-03-05 LAB — VITAMIN B12: Vitamin B-12: 651 pg/mL (ref 211–911)

## 2020-03-05 NOTE — Progress Notes (Signed)
Subjective:    Patient ID: Dana Cobb, female    DOB: [birth year], 69 y.o.   MRN: [id number]  HPI  Here to f/u; overall doing ok,  Pt denies chest pain, increasing sob or doe, wheezing, orthopnea, PND, increased LE swelling, palpitations, dizziness or syncope.  Pt denies new neurological symptoms such as new headache, or facial or extremity weakness or numbness.  Pt denies polydipsia, polyuria, or low sugar episode.  Pt states overall good compliance with meds, mostly trying to follow appropriate diet, with wt overall stable,  but little exercise however. Wt Readings from Last 3 Encounters:  03/05/20 179 lb (81.2 kg)  04/27/18 187 lb 8 oz (85 kg)  01/25/18 188 lb (85.3 kg)  Due for mammogram  Stopped takin the statin b/c not convinced she needs it.  No new complaints Past Medical History:  Diagnosis Date  . Allergic rhinitis   . Allergy   . Cataract   . Colon polyps 09/24/2017  . GERD (gastroesophageal reflux disease) 09/24/2017  . Hyperlipidemia   . Osteopenia 09/24/2017   Past Surgical History:  Procedure Laterality Date  . BUNIONECTOMY Left   . Gartner duct cyst    . HAMMER TOE SURGERY Left   . TONSILLECTOMY AND ADENOIDECTOMY    . VAGINAL HYSTERECTOMY    . WISDOM TOOTH EXTRACTION      reports that she has never smoked. She has never used smokeless tobacco. She reports current alcohol use. She reports that she does not use drugs. family history includes Aneurysm in her maternal grandfather; Cancer in her maternal grandmother; Colon cancer in her mother; Heart disease in her father and mother; Hyperlipidemia in her father; Stroke in her maternal grandmother and mother. Allergies  Allergen Reactions  . Codeine Anaphylaxis   Current Outpatient Medications on File Prior to Visit  Medication Sig Dispense Refill  . MAGNESIUM PO Take by mouth daily.     . Multiple Vitamins-Minerals (ICAPS AREDS FORMULA PO) Take by mouth.    Marland Kitchen omeprazole (PRILOSEC) 40 MG capsule Take 1 capsule (40  mg total) by mouth as needed. 90 capsule 3  . OVER THE COUNTER MEDICATION Vitamin D 600 mg, One capsule daily.    . Psyllium (METAMUCIL PO) Take by mouth.     No current facility-administered medications on file prior to visit.   Review of Systems All otherwise neg per pt    Objective:   Physical Exam BP 130/90 (BP Location: Left Arm, Patient Position: Sitting, Cuff Size: Large)   Pulse 87   Temp 98.7 F (37.1 C) (Oral)   Ht 5\' 3"  (1.6 m)   Wt 179 lb (81.2 kg)   SpO2 96%   BMI 31.71 kg/m  VS noted,  Constitutional: Pt appears in NAD HENT: Head: NCAT.  Right Ear: External ear normal.  Left Ear: External ear normal.  Eyes: . Pupils are equal, round, and reactive to light. Conjunctivae and EOM are normal Nose: without d/c or deformity Neck: Neck supple. Gross normal ROM Cardiovascular: Normal rate and regular rhythm.   Pulmonary/Chest: Effort normal and breath sounds without rales or wheezing.  Abd:  Soft, NT, ND, + BS, no organomegaly Neurological: Pt is alert. At baseline orientation, motor grossly intact Skin: Skin is warm. No rashes, other new lesions, no LE edema Psychiatric: Pt behavior is normal without agitation  All otherwise neg per pt Lab Results  Component Value Date   WBC 7.3 03/05/2020   HGB 15.2 (H) 03/05/2020   HCT 43.6 03/05/2020  PLT 276.0 03/05/2020   GLUCOSE 93 03/05/2020   CHOL 301 (H) 03/05/2020   TRIG 122.0 03/05/2020   HDL 61.40 03/05/2020   LDLCALC 215 (H) 03/05/2020   ALT 20 03/05/2020   AST 21 03/05/2020   NA 138 03/05/2020   K 4.2 03/05/2020   CL 102 03/05/2020   CREATININE 0.71 03/05/2020   BUN 15 03/05/2020   CO2 26 03/05/2020   TSH 2.06 03/05/2020      Assessment & Plan:

## 2020-03-05 NOTE — Patient Instructions (Signed)
You had the Tdap and Prevnar 13 shots today  Please continue all other medications as before, and refills have been done if requested.  Please have the pharmacy call with any other refills you may need.  Please continue your efforts at being more active, low cholesterol diet, and weight control.  You will be contacted regarding the referral for: Mammogram  You are otherwise up to date with prevention measures today.  Please keep your appointments with your specialists as you may have planned  Please go to the LAB at the blood drawing area for the tests to be done  You will be contacted by phone if any changes need to be made immediately.  Otherwise, you will receive a letter about your results with an explanation, but please check with MyChart first.  Please remember to sign up for MyChart if you have not done so, as this will be important to you in the future with finding out test results, communicating by private email, and scheduling acute appointments online when needed.  Please make an Appointment to return for your 1 year visit, or sooner if needed

## 2020-03-06 LAB — URINALYSIS, ROUTINE W REFLEX MICROSCOPIC
Bilirubin Urine: NEGATIVE
Hgb urine dipstick: NEGATIVE
Nitrite: NEGATIVE
Specific Gravity, Urine: >=1.03 — AB (ref 1.000–1.030)
Total Protein, Urine: NEGATIVE
Urine Glucose: NEGATIVE
Urobilinogen, UA: 0.2 (ref 0.0–1.0)
pH: 5.5 (ref 5.0–8.0)

## 2020-03-06 LAB — PTH, INTACT AND CALCIUM
Calcium: 9.7 mg/dL (ref 8.6–10.4)
PTH: 58 pg/mL (ref 14–64)

## 2020-03-09 ENCOUNTER — Encounter: Payer: Self-pay | Admitting: Internal Medicine

## 2020-03-09 NOTE — Assessment & Plan Note (Signed)
stable overall by history and exam, recent data reviewed with pt, and pt to continue medical treatment as before,  to f/u any worsening symptoms or concerns  

## 2020-03-09 NOTE — Assessment & Plan Note (Addendum)
stable overall by history and exam, recent data reviewed with pt, and pt to continue medical treatment as before,  to f/u any worsening symptoms or concerns  I spent 31 minutes in preparing to see the patient by review of recent labs, imaging and procedures, obtaining and reviewing separately obtained history, communicating with the patient and family or caregiver, ordering medications, tests or procedures, and documenting clinical information in the EHR including the differential Dx, treatment, and any further evaluation and other management of hlld, osteopenia, vit d def, allergies

## 2020-03-09 NOTE — Assessment & Plan Note (Signed)
For vit d 2000 qd ,  to f/u any worsening symptoms or concerns

## 2020-03-10 ENCOUNTER — Other Ambulatory Visit: Payer: Self-pay | Admitting: Internal Medicine

## 2020-03-10 ENCOUNTER — Encounter: Payer: Self-pay | Admitting: Internal Medicine

## 2020-03-10 MED ORDER — ATORVASTATIN CALCIUM 20 MG PO TABS: 20.0000 mg | ORAL_TABLET | Freq: Every day | ORAL | 3 refills | Status: DC

## 2020-04-03 ENCOUNTER — Other Ambulatory Visit: Payer: Self-pay | Admitting: Internal Medicine

## 2020-04-03 DIAGNOSIS — Z1231 Encounter for screening mammogram for malignant neoplasm of breast: Secondary | ICD-10-CM

## 2020-04-05 ENCOUNTER — Ambulatory Visit
Admission: RE | Admit: 2020-04-05 | Discharge: 2020-04-05 | Disposition: A | Discharge status: Home / Self Care | Payer: Medicare Other | Source: Ambulatory Visit

## 2020-04-05 ENCOUNTER — Other Ambulatory Visit: Payer: Self-pay

## 2020-04-05 DIAGNOSIS — Z1231 Encounter for screening mammogram for malignant neoplasm of breast: Secondary | ICD-10-CM

## 2020-05-25 DIAGNOSIS — Z20822 Contact with and (suspected) exposure to covid-19: Secondary | ICD-10-CM | POA: Diagnosis not present

## 2020-08-22 ENCOUNTER — Other Ambulatory Visit: Payer: Self-pay | Admitting: Internal Medicine

## 2020-08-22 DIAGNOSIS — Z1231 Encounter for screening mammogram for malignant neoplasm of breast: Secondary | ICD-10-CM

## 2021-01-08 ENCOUNTER — Encounter: Payer: Self-pay | Admitting: Emergency Medicine

## 2021-01-08 ENCOUNTER — Other Ambulatory Visit: Payer: Self-pay

## 2021-01-08 ENCOUNTER — Ambulatory Visit (INDEPENDENT_AMBULATORY_CARE_PROVIDER_SITE_OTHER): Payer: Medicare Other | Admitting: Emergency Medicine

## 2021-01-08 VITALS — BP 132/86 | HR 80 | Ht 63.0 in | Wt 189.0 lb

## 2021-01-08 DIAGNOSIS — L03116 Cellulitis of left lower limb: Secondary | ICD-10-CM

## 2021-01-08 MED ORDER — CEFADROXIL 500 MG PO CAPS: 500.0000 mg | ORAL_CAPSULE | Freq: Two times a day (BID) | ORAL | 0 refills | Status: AC

## 2021-01-08 MED ORDER — CLOTRIMAZOLE-BETAMETHASONE 1-0.05 % EX CREA: 1.0000 "application " | TOPICAL_CREAM | Freq: Two times a day (BID) | CUTANEOUS | 1 refills | Status: DC

## 2021-01-08 NOTE — Progress Notes (Signed)
Dana Cobb 70 y.o.   Chief Complaint  Patient presents with   Foot Pain    Left foot, red and swollen. X 3 days. Pt would like shingrix prescription sent to pharmacy    HISTORY OF PRESENT ILLNESS: Visit today for acute problem. This is a 70 y.o. female complaining of possible infection to left foot.  Complaining of redness and swelling for the past 3 days. Also inquiring about shingles vaccine requesting prescription be sent to her pharmacy of record.  No other complaints or medical concerns today.   Foot Pain Associated symptoms include a rash. Pertinent negatives include no abdominal pain, chest pain, congestion, coughing, fever, headaches, nausea, sore throat or vomiting.    Prior to Admission medications   Medication Sig Start Date End Date Taking? Authorizing Provider  atorvastatin (LIPITOR) 20 MG tablet Take 1 tablet (20 mg total) by mouth daily. 03/10/20 03/10/21 Yes Biagio Borg, MD  MAGNESIUM PO Take by mouth daily.    Yes [provider]  Multiple Vitamins-Minerals (ICAPS AREDS FORMULA PO) Take by mouth.   Yes [provider]  omeprazole (PRILOSEC) 40 MG capsule Take 1 capsule (40 mg total) by mouth as needed. 09/24/17  Yes Biagio Borg, MD  OVER THE COUNTER MEDICATION Vitamin D 600 mg, One capsule daily.   Yes [provider]  Psyllium (METAMUCIL PO) Take by mouth.   Yes [provider]    Allergies  Allergen Reactions   Codeine Anaphylaxis    Patient Active Problem List   Diagnosis Date Noted   Vitamin D deficiency 03/05/2020   GERD (gastroesophageal reflux disease) 09/24/2017   Colon polyps 09/24/2017   Osteopenia 09/24/2017   Allergic rhinitis    Hyperlipidemia     Past Medical History:  Diagnosis Date   Allergic rhinitis    Allergy    Cataract    Colon polyps 09/24/2017   GERD (gastroesophageal reflux disease) 09/24/2017   Hyperlipidemia    Osteopenia 09/24/2017    Past Surgical History:  Procedure  Laterality Date   BUNIONECTOMY Left    Gartner duct cyst     HAMMER TOE SURGERY Left    TONSILLECTOMY AND ADENOIDECTOMY     VAGINAL HYSTERECTOMY     WISDOM TOOTH EXTRACTION      Social History   Socioeconomic History   Marital status: Divorced    Spouse name: Not on file   Number of children: Not on file   Years of education: Not on file   Highest education level: Not on file  Occupational History   Not on file  Tobacco Use   Smoking status: Never   Smokeless tobacco: Never  Vaping Use   Vaping Use: Never used  Substance and Sexual Activity   Alcohol use: Yes    Comment: Occasional wine   Drug use: No   Sexual activity: Not on file  Other Topics Concern   Not on file  Social History Narrative   Not on file   Social Determinants of Health   Financial Resource Strain: Low Risk    Difficulty of Paying Living Expenses: Not hard at all  Food Insecurity: No Food Insecurity   Worried About Charity fundraiser in the Last Year: Never true   Arboriculturist in the Last Year: Never true  Transportation Needs: No Transportation Needs   Lack of Transportation (Medical): No   Lack of Transportation (Non-Medical): No  Physical Activity: Inactive   Days of Exercise per Week: 0  days   Minutes of Exercise per Session: 0 min  Stress: No Stress Concern Present   Feeling of Stress : Not at all  Social Connections: Socially Isolated   Frequency of Communication with Friends and Family: More than three times a week   Frequency of Social Gatherings with Friends and Family: Twice a week   Attends Religious Services: Never   Marine scientist or Organizations: No   Attends Archivist Meetings: Never   Marital Status: Widowed  Human resources officer Violence: Not on file    Family History  Problem Relation Age of Onset   Heart disease Mother    Stroke Mother    Colon cancer Mother    Heart disease Father    Hyperlipidemia Father    Stroke Maternal Grandmother     Cancer Maternal Grandmother    Aneurysm Maternal Grandfather    Esophageal cancer Neg Hx    Rectal cancer Neg Hx    Stomach cancer Neg Hx      Review of Systems  Constitutional: Negative.  Negative for fever.  HENT: Negative.  Negative for congestion and sore throat.   Respiratory: Negative.  Negative for cough and shortness of breath.   Cardiovascular:  Negative for chest pain and palpitations.  Gastrointestinal:  Negative for abdominal pain, diarrhea, nausea and vomiting.  Genitourinary: Negative.  Negative for dysuria and hematuria.  Skin:  Positive for rash.  Neurological: Negative.  Negative for dizziness and headaches.  All other systems reviewed and are negative.  Today's Vitals   01/08/21 1531  BP: 132/86  Pulse: 80  SpO2: 97%  Weight: 189 lb (85.7 kg)  Height: 5\' 3"  (1.6 m)   Body mass index is 33.48 kg/m.  Physical Exam Vitals reviewed.  Constitutional:      Appearance: Normal appearance.  HENT:     Head: Normocephalic.  Eyes:     Extraocular Movements: Extraocular movements intact.     Pupils: Pupils are equal, round, and reactive to light.  Cardiovascular:     Rate and Rhythm: Normal rate.  Pulmonary:     Effort: Pulmonary effort is normal.  Musculoskeletal:     Cervical back: Normal range of motion.  Skin:    General: Skin is warm and dry.     Capillary Refill: Capillary refill takes less than 2 seconds.     Comments: Left foot: Positive redness and swollen mostly to metatarsal phalangeal areas of second third fourth toes compatible with cellulitis.  May also have yeast infection.  Neurological:     General: No focal deficit present.     Mental Status: She is alert and oriented to person, place, and time.  Psychiatric:        Mood and Affect: Mood normal.        Behavior: Behavior normal.     ASSESSMENT & PLAN: Dana Cobb was seen today for foot pain.  Diagnoses and all orders for this visit:  Cellulitis of left foot -      clotrimazole-betamethasone (LOTRISONE) cream; Apply 1 application topically 2 (two) times daily. -     cefadroxil (DURICEF) 500 MG capsule; Take 1 capsule (500 mg total) by mouth 2 (two) times daily for 7 days.  Other orders -     Varicella-zoster vaccine IM  Patient Instructions  Cellulitis, Adult Cellulitis is a skin infection. The infected area is often warm, red, swollen, and sore. It occurs most often in the arms and lower legs. It is very important to get  treated for this condition. What are the causes? This condition is caused by bacteria. The bacteria enter through a break in the skin, such as a cut, burn, insect bite, open sore, or crack. What increases the risk? This condition is more likely to occur in people who: Have a weak body defense system (immune system). Have open cuts, burns, bites, or scrapes on the skin. Are older than 70 years of age. Have a blood sugar problem (diabetes). Have a long-lasting (chronic) liver disease (cirrhosis) or kidney disease. Are very overweight (obese). Have a skin problem, such as: Itchy rash (eczema). Slow movement of blood in the veins (venous stasis). Fluid buildup below the skin (edema). Have been treated with high-energy rays (radiation). Use IV drugs. What are the signs or symptoms? Symptoms of this condition include: Skin that is: Red. Streaking. Spotting. Swollen. Sore or painful when you touch it. Warm. A fever. Chills. Blisters. How is this diagnosed? This condition is diagnosed based on: Medical history. Physical exam. Blood tests. Imaging tests. How is this treated? Treatment for this condition may include: Medicines to treat infections or allergies. Home care, such as: Rest. Placing cold or warm cloths (compresses) on the skin. Hospital care, if the condition is very bad. Follow these instructions at home: Medicines Take over-the-counter and prescription medicines only as told by your doctor. If you were  prescribed an antibiotic medicine, take it as told by your doctor. Do not stop taking it even if you start to feel better. General instructions  Drink enough fluid to keep your pee (urine) pale yellow. Do not touch or rub the infected area. Raise (elevate) the infected area above the level of your heart while you are sitting or lying down. Place cold or warm cloths on the area as told by your doctor. Keep all follow-up visits as told by your doctor. This is important. Contact a doctor if: You have a fever. You do not start to get better after 1-2 days of treatment. Your bone or joint under the infected area starts to hurt after the skin has healed. Your infection comes back. This can happen in the same area or another area. You have a swollen bump in the area. You have new symptoms. You feel ill and have muscle aches and pains. Get help right away if: Your symptoms get worse. You feel very sleepy. You throw up (vomit) or have watery poop (diarrhea) for a long time. You see red streaks coming from the area. Your red area gets larger. Your red area turns dark in color. These symptoms may represent a serious problem that is an emergency. Do not wait to see if the symptoms will go away. Get medical help right away. Call your local emergency services (911 in the [place] Do not drive yourself to the hospital. Summary Cellulitis is a skin infection. The area is often warm, red, swollen, and sore. This condition is treated with medicines, rest, and cold and warm cloths. Take all medicines only as told by your doctor. Tell your doctor if symptoms do not start to get better after 1-2 days of treatment. This information is not intended to replace advice given to you by your health care provider. Make sure you discuss any questions you have with your health care provider. Document Revised: 08/26/2017 Document Reviewed: 08/26/2017 Elsevier Patient Education  2022 [place], MD [facility]

## 2021-01-08 NOTE — Patient Instructions (Signed)
Cellulitis, Adult Cellulitis is a skin infection. The infected area is often warm, red, swollen, and sore. It occurs most often in the arms and lower legs. It is very important to get treated for this condition. What are the causes? This condition is caused by bacteria. The bacteria enter through a break in the skin, such as a cut, burn, insect bite, open sore, or crack. What increases the risk? This condition is more likely to occur in people who: Have a weak body defense system (immune system). Have open cuts, burns, bites, or scrapes on the skin. Are older than 70 years of age. Have a blood sugar problem (diabetes). Have a long-lasting (chronic) liver disease (cirrhosis) or kidney disease. Are very overweight (obese). Have a skin problem, such as: Itchy rash (eczema). Slow movement of blood in the veins (venous stasis). Fluid buildup below the skin (edema). Have been treated with high-energy rays (radiation). Use IV drugs. What are the signs or symptoms? Symptoms of this condition include: Skin that is: Red. Streaking. Spotting. Swollen. Sore or painful when you touch it. Warm. A fever. Chills. Blisters. How is this diagnosed? This condition is diagnosed based on: Medical history. Physical exam. Blood tests. Imaging tests. How is this treated? Treatment for this condition may include: Medicines to treat infections or allergies. Home care, such as: Rest. Placing cold or warm cloths (compresses) on the skin. Hospital care, if the condition is very bad. Follow these instructions at home: Medicines Take over-the-counter and prescription medicines only as told by your doctor. If you were prescribed an antibiotic medicine, take it as told by your doctor. Do not stop taking it even if you start to feel better. General instructions  Drink enough fluid to keep your pee (urine) pale yellow. Do not touch or rub the infected area. Raise (elevate) the infected area above the  level of your heart while you are sitting or lying down. Place cold or warm cloths on the area as told by your doctor. Keep all follow-up visits as told by your doctor. This is important. Contact a doctor if: You have a fever. You do not start to get better after 1-2 days of treatment. Your bone or joint under the infected area starts to hurt after the skin has healed. Your infection comes back. This can happen in the same area or another area. You have a swollen bump in the area. You have new symptoms. You feel ill and have muscle aches and pains. Get help right away if: Your symptoms get worse. You feel very sleepy. You throw up (vomit) or have watery poop (diarrhea) for a long time. You see red streaks coming from the area. Your red area gets larger. Your red area turns dark in color. These symptoms may represent a serious problem that is an emergency. Do not wait to see if the symptoms will go away. Get medical help right away. Call your local emergency services (911 in the [place] Do not drive yourself to the hospital. Summary Cellulitis is a skin infection. The area is often warm, red, swollen, and sore. This condition is treated with medicines, rest, and cold and warm cloths. Take all medicines only as told by your doctor. Tell your doctor if symptoms do not start to get better after 1-2 days of treatment. This information is not intended to replace advice given to you by your health care provider. Make sure you discuss any questions you have with your health care provider. Document Revised: 08/26/2017 Document Reviewed:   08/26/2017 Elsevier Patient Education  2022 Elsevier Inc[employer]  

## 2021-01-13 ENCOUNTER — Telehealth: Payer: Self-pay

## 2021-01-13 ENCOUNTER — Encounter: Payer: Self-pay | Admitting: Emergency Medicine

## 2021-01-13 NOTE — Telephone Encounter (Signed)
Please advise as the pt has a concern of not healing as fast as she should be with being on day 5 of her ABX that was rx'd.  Pt states she will send photos of her foot via MyChart to get providers opinion.

## 2021-01-14 NOTE — Telephone Encounter (Signed)
Where are the pictures she is talking about?

## 2021-01-15 NOTE — Telephone Encounter (Signed)
Called pt and left a vm to return call

## 2021-01-15 NOTE — Telephone Encounter (Signed)
This is a slow healing process.  It is running its course.  She should follow-up with her PCP Dr. Oliver Barre.  Thanks.

## 2021-01-15 NOTE — Telephone Encounter (Signed)
Already addressed. Thanks

## 2021-01-20 ENCOUNTER — Other Ambulatory Visit: Payer: Self-pay

## 2021-01-20 ENCOUNTER — Encounter: Payer: Self-pay | Admitting: Internal Medicine

## 2021-01-20 ENCOUNTER — Ambulatory Visit (INDEPENDENT_AMBULATORY_CARE_PROVIDER_SITE_OTHER): Payer: Medicare Other | Admitting: Internal Medicine

## 2021-01-20 VITALS — BP 150/72 | HR 81 | Temp 99.4°F | Ht 63.0 in | Wt 187.0 lb

## 2021-01-20 DIAGNOSIS — E2839 Other primary ovarian failure: Secondary | ICD-10-CM

## 2021-01-20 DIAGNOSIS — M8580 Other specified disorders of bone density and structure, unspecified site: Secondary | ICD-10-CM

## 2021-01-20 DIAGNOSIS — E538 Deficiency of other specified B group vitamins: Secondary | ICD-10-CM | POA: Diagnosis not present

## 2021-01-20 DIAGNOSIS — E559 Vitamin D deficiency, unspecified: Secondary | ICD-10-CM

## 2021-01-20 DIAGNOSIS — R030 Elevated blood-pressure reading, without diagnosis of hypertension: Secondary | ICD-10-CM

## 2021-01-20 DIAGNOSIS — E7800 Pure hypercholesterolemia, unspecified: Secondary | ICD-10-CM

## 2021-01-20 LAB — URINALYSIS, ROUTINE W REFLEX MICROSCOPIC
Bilirubin Urine: NEGATIVE
Hgb urine dipstick: NEGATIVE
Ketones, ur: NEGATIVE
Nitrite: NEGATIVE
RBC / HPF: NONE SEEN (ref 0–?)
Specific Gravity, Urine: 1.025 (ref 1.000–1.030)
Total Protein, Urine: NEGATIVE
Urine Glucose: NEGATIVE
Urobilinogen, UA: 0.2 (ref 0.0–1.0)
pH: 5.5 (ref 5.0–8.0)

## 2021-01-20 LAB — CBC WITH DIFFERENTIAL/PLATELET
Basophils Absolute: 0 10*3/uL (ref 0.0–0.1)
Basophils Relative: 0.3 % (ref 0.0–3.0)
Eosinophils Absolute: 0.1 10*3/uL (ref 0.0–0.7)
Eosinophils Relative: 1 % (ref 0.0–5.0)
HCT: 41.5 % (ref 36.0–46.0)
Hemoglobin: 14.5 g/dL (ref 12.0–15.0)
Lymphocytes Relative: 21.1 % (ref 12.0–46.0)
Lymphs Abs: 1.4 10*3/uL (ref 0.7–4.0)
MCHC: 34.9 g/dL (ref 30.0–36.0)
MCV: 87.6 fl (ref 78.0–100.0)
Monocytes Absolute: 0.4 10*3/uL (ref 0.1–1.0)
Monocytes Relative: 6 % (ref 3.0–12.0)
Neutro Abs: 4.6 10*3/uL (ref 1.4–7.7)
Neutrophils Relative %: 71.6 % (ref 43.0–77.0)
Platelets: 272 10*3/uL (ref 150.0–400.0)
RBC: 4.74 Mil/uL (ref 3.87–5.11)
RDW: 13.2 % (ref 11.5–15.5)
WBC: 6.4 10*3/uL (ref 4.0–10.5)

## 2021-01-20 LAB — BASIC METABOLIC PANEL
BUN: 13 mg/dL (ref 6–23)
CO2: 29 mEq/L (ref 19–32)
Calcium: 9.7 mg/dL (ref 8.4–10.5)
Chloride: 103 mEq/L (ref 96–112)
Creatinine, Ser: 0.74 mg/dL (ref 0.40–1.20)
GFR: 82.09 mL/min (ref >60.00)
Glucose, Bld: 89 mg/dL (ref 70–99)
Potassium: 4.5 mEq/L (ref 3.5–5.1)
Sodium: 140 mEq/L (ref 135–145)

## 2021-01-20 LAB — HEPATIC FUNCTION PANEL
ALT: 17 U/L (ref 0–35)
AST: 19 U/L (ref 0–37)
Albumin: 4.4 g/dL (ref 3.5–5.2)
Alkaline Phosphatase: 63 U/L (ref 39–117)
Bilirubin, Direct: 0.1 mg/dL (ref 0.0–0.3)
Total Bilirubin: 0.4 mg/dL (ref 0.2–1.2)
Total Protein: 7.2 g/dL (ref 6.0–8.3)

## 2021-01-20 LAB — LIPID PANEL
Cholesterol: 273 mg/dL — ABNORMAL HIGH (ref 0–200)
HDL: 58.9 mg/dL (ref >39.00)
LDL Cholesterol: 185 mg/dL — ABNORMAL HIGH (ref 0–99)
NonHDL: 214.17
Total CHOL/HDL Ratio: 5
Triglycerides: 144 mg/dL (ref 0.0–149.0)
VLDL: 28.8 mg/dL (ref 0.0–40.0)

## 2021-01-20 LAB — VITAMIN B12: Vitamin B-12: 807 pg/mL (ref 211–911)

## 2021-01-20 LAB — VITAMIN D 25 HYDROXY (VIT D DEFICIENCY, FRACTURES): VITD: 29.52 ng/mL — ABNORMAL LOW (ref 30.00–100.00)

## 2021-01-20 LAB — TSH: TSH: 3.27 u[IU]/mL (ref 0.35–5.50)

## 2021-01-20 MED ORDER — CHOLECALCIFEROL 50 MCG (2000 UT) PO TABS: ORAL_TABLET | ORAL | 11 refills | Status: DC

## 2021-01-20 NOTE — Assessment & Plan Note (Signed)
Also due for DXA f/u

## 2021-01-20 NOTE — Assessment & Plan Note (Signed)
Last vitamin D Lab Results  Component Value Date   VD25OH 27.77 (L) 03/05/2020   Low, to start replacement

## 2021-01-20 NOTE — Progress Notes (Signed)
Patient ID: Dana Cobb, female   DOB: 1951/01/01, 70 y.o.   MRN: [id number]        Chief Complaint: follow up elevated bp, HLD and low vit d       HPI:  Dana Cobb is a 70 y.o. female here with above overall doing ok. Not taking Vit d.  Did not take lipitor - stopped after it seemed her hair fell out more and more aches and pains    Is willing to try a different med.   Gained 8 lbs, BP higher today but wants to check BP at home further.  Traveling soon to [place] for thanksgiving.  Pt denies chest pain, increased sob or doe, wheezing, orthopnea, PND, increased LE swelling, palpitations, dizziness or syncope.   Pt denies polydipsia, polyuria, or new focal neuro s/s.   Pt denies fever, wt loss, night sweats, loss of appetite, or other constitutional symptoms  Wt Readings from Last 3 Encounters:  01/20/21 187 lb (84.8 kg)  01/08/21 189 lb (85.7 kg)  03/05/20 179 lb (81.2 kg)   BP Readings from Last 3 Encounters:  01/20/21 (!) 150/72  01/08/21 132/86  03/05/20 130/90         Past Medical History:  Diagnosis Date   Allergic rhinitis    Allergy    Cataract    Colon polyps 09/24/2017   GERD (gastroesophageal reflux disease) 09/24/2017   Hyperlipidemia    Osteopenia 09/24/2017   Past Surgical History:  Procedure Laterality Date   BUNIONECTOMY Left    Gartner duct cyst     HAMMER TOE SURGERY Left    TONSILLECTOMY AND ADENOIDECTOMY     VAGINAL HYSTERECTOMY     WISDOM TOOTH EXTRACTION      reports that she has never smoked. She has never used smokeless tobacco. She reports current alcohol use. She reports that she does not use drugs. family history includes Aneurysm in her maternal grandfather; Cancer in her maternal grandmother; Colon cancer in her mother; Heart disease in her father and mother; Hyperlipidemia in her father; Stroke in her maternal grandmother and mother. Allergies  Allergen Reactions   Codeine Anaphylaxis   Lipitor [Atorvastatin] Other (See Comments)     myalgias   Current Outpatient Medications on File Prior to Visit  Medication Sig Dispense Refill   clotrimazole-betamethasone (LOTRISONE) cream Apply 1 application topically 2 (two) times daily. 30 g 1   MAGNESIUM PO Take by mouth daily.      Multiple Vitamins-Minerals (ICAPS AREDS FORMULA PO) Take by mouth.     omeprazole (PRILOSEC) 40 MG capsule Take 1 capsule (40 mg total) by mouth as needed. 90 capsule 3   OVER THE COUNTER MEDICATION Vitamin D 600 mg, One capsule daily.     Psyllium (METAMUCIL PO) Take by mouth.     No current facility-administered medications on file prior to visit.        ROS:  All others reviewed and negative.  Objective        PE:  BP (!) 150/72 (BP Location: Right Arm, Patient Position: Sitting, Cuff Size: Normal)   Pulse 81   Temp 99.4 F (37.4 C) (Oral)   Ht 5\' 3"  (1.6 m)   Wt 187 lb (84.8 kg)   SpO2 98%   BMI 33.13 kg/m                 Constitutional: Pt appears in NAD               HENT:  Head: NCAT.                Right Ear: External ear normal.                 Left Ear: External ear normal.                Eyes: . Pupils are equal, round, and reactive to light. Conjunctivae and EOM are normal               Nose: without d/c or deformity               Neck: Neck supple. Gross normal ROM               Cardiovascular: Normal rate and regular rhythm.                 Pulmonary/Chest: Effort normal and breath sounds without rales or wheezing.                Abd:  Soft, NT, ND, + BS, no organomegaly               Neurological: Pt is alert. At baseline orientation, motor grossly intact               Skin: Skin is warm. No rashes, no other new lesions, LE edema - none               Psychiatric: Pt behavior is normal without agitation   Micro: none  Cardiac tracings I have personally interpreted today:  none  Pertinent Radiological findings (summarize): none   Lab Results  Component Value Date   WBC 6.4 01/20/2021   HGB 14.5 01/20/2021   HCT 41.5  01/20/2021   PLT 272.0 01/20/2021   GLUCOSE 89 01/20/2021   CHOL 273 (H) 01/20/2021   TRIG 144.0 01/20/2021   HDL 58.90 01/20/2021   LDLCALC 185 (H) 01/20/2021   ALT 17 01/20/2021   AST 19 01/20/2021   NA 140 01/20/2021   K 4.5 01/20/2021   CL 103 01/20/2021   CREATININE 0.74 01/20/2021   BUN 13 01/20/2021   CO2 29 01/20/2021   TSH 3.27 01/20/2021   Assessment/Plan:  Dana Cobb is a 70 y.o. White or Caucasian [1] female with  has a past medical history of Allergic rhinitis, Allergy, Cataract, Colon polyps (09/24/2017), GERD (gastroesophageal reflux disease) (09/24/2017), Hyperlipidemia, and Osteopenia (09/24/2017).  Vitamin D deficiency Last vitamin D Lab Results  Component Value Date   VD25OH 27.77 (L) 03/05/2020   Low, to start replacement   Blood pressure elevated without history of HTN BP Readings from Last 3 Encounters:  01/20/21 (!) 150/72  01/08/21 132/86  03/05/20 130/90   Uncontrolled today, pt to continue medical treatment as declines change today - diet, wt control, low salt excercise       Current Outpatient Medications (Other):    Cholecalciferol 50 MCG (2000 UT) TABS, 1 tab by mouth once daily   clotrimazole-betamethasone (LOTRISONE) cream, Apply 1 application topically 2 (two) times daily.   MAGNESIUM PO, Take by mouth daily.    Multiple Vitamins-Minerals (ICAPS AREDS FORMULA PO), Take by mouth.   omeprazole (PRILOSEC) 40 MG capsule, Take 1 capsule (40 mg total) by mouth as needed.   OVER THE COUNTER MEDICATION, Vitamin D 600 mg, One capsule daily.   Psyllium (METAMUCIL PO), Take by mouth.   Osteopenia Also due for DXA f/u  Hyperlipidemia Lab Results  Component Value Date   [facility]  185 (H) 01/20/2021   Stable, pt to continue current low chol diet due to statin intolerance,, check cardiac CT score, refer lipid clinic  Followup: Return in about 6 months (around 07/21/2021).  Oliver Barre, MD 01/20/2021 10:47 PM [facility]

## 2021-01-20 NOTE — Assessment & Plan Note (Signed)
BP Readings from Last 3 Encounters:  01/20/21 (!) 150/72  01/08/21 132/86  03/05/20 130/90   Uncontrolled today, pt to continue medical treatment as declines change today - diet, wt control, low salt excercise       Current Outpatient Medications (Other):  Marland Kitchen  Cholecalciferol 50 MCG (2000 UT) TABS, 1 tab by mouth once daily .  clotrimazole-betamethasone (LOTRISONE) cream, Apply 1 application topically 2 (two) times daily. Marland Kitchen  MAGNESIUM PO, Take by mouth daily.  .  Multiple Vitamins-Minerals (ICAPS AREDS FORMULA PO), Take by mouth. Marland Kitchen  omeprazole (PRILOSEC) 40 MG capsule, Take 1 capsule (40 mg total) by mouth as needed. Marland Kitchen  OVER THE COUNTER MEDICATION, Vitamin D 600 mg, One capsule daily. .  Psyllium (METAMUCIL PO), Take by mouth.

## 2021-01-20 NOTE — Assessment & Plan Note (Signed)
Lab Results  Component Value Date   LDLCALC 185 (H) 01/20/2021   Stable, pt to continue current low chol diet due to statin intolerance,, check cardiac CT score, refer lipid clinic

## 2021-01-20 NOTE — Patient Instructions (Addendum)
Please schedule the bone density test before leaving today at the scheduling desk (where you check out)  Please consider the Novavax this month instead of the covid booster  Please check your BP at home with the goal to be less than 140/90  Please continue all other medications as before, and refills have been done if requested.  Please have the pharmacy call with any other refills you may need.  Please continue your efforts at being more active, low cholesterol diet, and weight control.  You are otherwise up to date with prevention measures today.  Please keep your appointments with your specialists as you may have planned  You will be contacted regarding the referral for: Lipid clinic  We have discussed the Cardiac CT Score test to measure the calcification level (if any) in your heart arteries.  This test has been ordered in our Computer System, so please call [facility] directly, as they prefer this, at [phone] to be scheduled.  Please go to the LAB at the blood drawing area for the tests to be done  You will be contacted by phone if any changes need to be made immediately.  Otherwise, you will receive a letter about your results with an explanation, but please check with MyChart first.  Please remember to sign up for MyChart if you have not done so, as this will be important to you in the future with finding out test results, communicating by private email, and scheduling acute appointments online when needed.  Please make an Appointment to return in 6 months, or sooner if needed, to recheck the blood pressure

## 2021-01-22 ENCOUNTER — Other Ambulatory Visit: Payer: Self-pay

## 2021-01-22 ENCOUNTER — Ambulatory Visit (INDEPENDENT_AMBULATORY_CARE_PROVIDER_SITE_OTHER)
Admission: RE | Admit: 2021-01-22 | Discharge: 2021-01-22 | Disposition: A | Discharge status: Home / Self Care | Payer: Medicare Other | Source: Ambulatory Visit | Attending: Internal Medicine | Admitting: Internal Medicine

## 2021-01-22 DIAGNOSIS — E2839 Other primary ovarian failure: Secondary | ICD-10-CM

## 2021-01-26 ENCOUNTER — Encounter: Payer: Self-pay | Admitting: Internal Medicine

## 2021-02-26 ENCOUNTER — Telehealth (INDEPENDENT_AMBULATORY_CARE_PROVIDER_SITE_OTHER): Payer: Medicare Other | Admitting: Internal Medicine

## 2021-02-26 ENCOUNTER — Encounter: Payer: Self-pay | Admitting: Internal Medicine

## 2021-02-26 DIAGNOSIS — J069 Acute upper respiratory infection, unspecified: Secondary | ICD-10-CM | POA: Diagnosis not present

## 2021-02-26 DIAGNOSIS — J309 Allergic rhinitis, unspecified: Secondary | ICD-10-CM | POA: Diagnosis not present

## 2021-02-26 DIAGNOSIS — E559 Vitamin D deficiency, unspecified: Secondary | ICD-10-CM | POA: Diagnosis not present

## 2021-02-26 MED ORDER — LEVOFLOXACIN 500 MG PO TABS: 500.0000 mg | ORAL_TABLET | Freq: Every day | ORAL | 0 refills | Status: AC

## 2021-02-26 NOTE — Assessment & Plan Note (Signed)
Also to add nasacort to reduce hopefully nasal and ear congestive symptoms

## 2021-02-26 NOTE — Progress Notes (Signed)
Patient ID: Dana Cobb, female   DOB: March 16, 1951, 70 y.o.   MRN: [id number]

## 2021-02-26 NOTE — Assessment & Plan Note (Signed)
Mild to mod, for antibx course,  to f/u any worsening symptoms or concerns 

## 2021-02-26 NOTE — Progress Notes (Signed)
Patient ID: Dana Cobb, female   DOB: Sep 14, 1950, 70 y.o.   MRN: [id number]  Virtual Visit via Video Note  I connected with Dana Cobb on 02/26/21 at 10:20 AM EST by a video enabled telemedicine application and verified that I am speaking with the correct person using two identifiers.  Location of all participants today Patient: at home Provider: at office   I discussed the limitations of evaluation and management by telemedicine and the availability of in person appointments. The patient expressed understanding and agreed to proceed.  History of Present Illness:  Here with 3 days acute onset fever, facial pain, bilat ear pain and  pressure, headache, general weakness and malaise,  with mild ST and scant prod cough, but pt denies chest pain, wheezing, increased sob or doe, orthopnea, PND, increased LE swelling, palpitations, dizziness or syncope.  Had recent infection after visiting grandson in [place] (covid/flu/rsv neg) that seemed to linger, now symptoms worsening again more sinus related this time it seems, not much in the chest.  Has been taking mucinex with phenylephrine and tylenol but just not improving,  Plans to travel again next tues to [place] Past Medical History:  Diagnosis Date   Allergic rhinitis    Allergy    Cataract    Colon polyps 09/24/2017   GERD (gastroesophageal reflux disease) 09/24/2017   Hyperlipidemia    Osteopenia 09/24/2017   Past Surgical History:  Procedure Laterality Date   BUNIONECTOMY Left    Gartner duct cyst     HAMMER TOE SURGERY Left    TONSILLECTOMY AND ADENOIDECTOMY     VAGINAL HYSTERECTOMY     WISDOM TOOTH EXTRACTION      reports that she has never smoked. She has never used smokeless tobacco. She reports current alcohol use. She reports that she does not use drugs. family history includes Aneurysm in her maternal grandfather; Cancer in her maternal grandmother; Colon cancer in her mother; Heart disease in her father and  mother; Hyperlipidemia in her father; Stroke in her maternal grandmother and mother. Allergies  Allergen Reactions   Codeine Anaphylaxis   Lipitor [Atorvastatin] Other (See Comments)    myalgias   Current Outpatient Medications on File Prior to Visit  Medication Sig Dispense Refill   Cholecalciferol 50 MCG (2000 UT) TABS 1 tab by mouth once daily 30 tablet 11   clotrimazole-betamethasone (LOTRISONE) cream Apply 1 application topically 2 (two) times daily. 30 g 1   MAGNESIUM PO Take by mouth daily.      Multiple Vitamins-Minerals (ICAPS AREDS FORMULA PO) Take by mouth.     omeprazole (PRILOSEC) 40 MG capsule Take 1 capsule (40 mg total) by mouth as needed. 90 capsule 3   OVER THE COUNTER MEDICATION Vitamin D 600 mg, One capsule daily.     Psyllium (METAMUCIL PO) Take by mouth.     No current facility-administered medications on file prior to visit.    Observations/Objective: Alert, NAD, appropriate mood and affect, resps normal, cn 2-12 intact, moves all 4s, no visible rash or swelling Lab Results  Component Value Date   WBC 6.4 01/20/2021   HGB 14.5 01/20/2021   HCT 41.5 01/20/2021   PLT 272.0 01/20/2021   GLUCOSE 89 01/20/2021   CHOL 273 (H) 01/20/2021   TRIG 144.0 01/20/2021   HDL 58.90 01/20/2021   LDLCALC 185 (H) 01/20/2021   ALT 17 01/20/2021   AST 19 01/20/2021   NA 140 01/20/2021   K 4.5 01/20/2021   CL 103 01/20/2021  CREATININE 0.74 01/20/2021   BUN 13 01/20/2021   CO2 29 01/20/2021   TSH 3.27 01/20/2021   Assessment and Plan: See notes  Follow Up Instructions: See notes   I discussed the assessment and treatment plan with the patient. The patient was provided an opportunity to ask questions and all were answered. The patient agreed with the plan and demonstrated an understanding of the instructions.   The patient was advised to call back or seek an in-person evaluation if the symptoms worsen or if the condition fails to improve as anticipated.   Oliver Barre, MD

## 2021-02-26 NOTE — Patient Instructions (Signed)
Please take all new medication as prescribed 

## 2021-02-26 NOTE — Assessment & Plan Note (Signed)
Last vitamin D Lab Results  Component Value Date   VD25OH 29.52 (L) 01/20/2021   Low, reminded to start oral replacement

## 2021-04-03 ENCOUNTER — Telehealth: Payer: Self-pay | Admitting: Internal Medicine

## 2021-04-03 NOTE — Telephone Encounter (Signed)
Left message for patient to call me back at [phone] to schedule Medicare Annual Wellness Visit  ° °Last AWV  03/01/20 ° °Please schedule at anytime with [facility] Health Advisor if patient calls the office back.   ° °40 Minutes appointment  ° °Any questions, please call me at [phone]  °

## 2021-04-07 ENCOUNTER — Ambulatory Visit
Admission: RE | Admit: 2021-04-07 | Discharge: 2021-04-07 | Disposition: A | Discharge status: Home / Self Care | Payer: Medicare Other | Source: Ambulatory Visit | Attending: Internal Medicine | Admitting: Internal Medicine

## 2021-04-07 DIAGNOSIS — Z1231 Encounter for screening mammogram for malignant neoplasm of breast: Secondary | ICD-10-CM | POA: Diagnosis not present

## 2021-04-08 ENCOUNTER — Encounter: Payer: Self-pay | Admitting: Internal Medicine

## 2021-06-24 ENCOUNTER — Telehealth: Payer: Medicare Other | Admitting: Nurse Practitioner

## 2021-06-24 DIAGNOSIS — J0140 Acute pansinusitis, unspecified: Secondary | ICD-10-CM | POA: Diagnosis not present

## 2021-06-24 MED ORDER — AMOXICILLIN-POT CLAVULANATE 875-125 MG PO TABS: 1.0000 | ORAL_TABLET | Freq: Two times a day (BID) | ORAL | 0 refills | Status: AC

## 2021-06-24 NOTE — Progress Notes (Signed)
?Virtual Visit Consent  ? ?Dana Cobb, you are scheduled for a virtual visit with a [facility] provider today.   ?  ?Just as with appointments in the office, your consent must be obtained to participate.  Your consent will be active for this visit and any virtual visit you may have with one of our providers in the next 365 days.   ?  ?If you have a MyChart account, a copy of this consent can be sent to you electronically.  All virtual visits are billed to your insurance company just like a traditional visit in the office.   ? ?As this is a virtual visit, video technology does not allow for your provider to perform a traditional examination.  This may limit your provider's ability to fully assess your condition.  If your provider identifies any concerns that need to be evaluated in person or the need to arrange testing (such as labs, EKG, etc.), we will make arrangements to do so.   ?  ?Although advances in technology are sophisticated, we cannot ensure that it will always work on either your end or our end.  If the connection with a video visit is poor, the visit may have to be switched to a telephone visit.  With either a video or telephone visit, we are not always able to ensure that we have a secure connection.    ? ?I need to obtain your verbal consent now.   Are you willing to proceed with your visit today?  ?  ?Dana Cobb has provided verbal consent on 06/24/2021 for a virtual visit (video or telephone). ?  ?Apolonio Schneiders, FNP  ? ?Date: 06/24/2021 3:26 PM ? ? ?Virtual Visit via Video Note  ? ?IApolonio Schneiders, connected with  Dana Cobb  (TO:[id number], [birth year]) on 06/24/21 at  3:30 PM EST by a video-enabled telemedicine application and verified that I am speaking with the correct person using two identifiers. ? ?Location: ?Patient: Virtual Visit Location Patient: Home ?Provider: Virtual Visit Location Provider: Home Office ?  ?I discussed the limitations of evaluation and management by  telemedicine and the availability of in person appointments. The patient expressed understanding and agreed to proceed.   ? ?History of Present Illness: ?Dana Cobb is a 71 y.o. who identifies as a female who was assigned female at birth, and is being seen today with complaints of sinus pressure and congestion. She has lost her voice, has PND. Pressure in her ears and a slight cough that is productive. She does have a history of sinusitis and URI infections ? ?She denies a fever  ? ?She recently traveled, she flew home yesterday- she was in [facility]  ? ?Was exposed to someone known to have Laureles  ?She did take a home test today and was negative.  ? ?She has been using Aleve, Mucinex, and her allergy medicine  ? ?Problems:  ?Patient Active Problem List  ? Diagnosis Date Noted  ? URI (upper respiratory infection) 02/26/2021  ? Blood pressure elevated without history of HTN 01/20/2021  ? Vitamin D deficiency 03/05/2020  ? GERD (gastroesophageal reflux disease) 09/24/2017  ? Colon polyps 09/24/2017  ? Osteopenia 09/24/2017  ? Allergic rhinitis   ? Hyperlipidemia   ?  ?Allergies:  ?Allergies  ?Allergen Reactions  ? Codeine Anaphylaxis  ? Lipitor [Atorvastatin] Other (See Comments)  ?  myalgias  ? ?Medications:  ?Current Outpatient Medications:  ?  Cholecalciferol 50 MCG (2000 UT) TABS, 1 tab  by mouth once daily, Disp: 30 tablet, Rfl: 11 ?  clotrimazole-betamethasone (LOTRISONE) cream, Apply 1 application topically 2 (two) times daily., Disp: 30 g, Rfl: 1 ?  MAGNESIUM PO, Take by mouth daily. , Disp: , Rfl:  ?  Multiple Vitamins-Minerals (ICAPS AREDS FORMULA PO), Take by mouth., Disp: , Rfl:  ?  omeprazole (PRILOSEC) 40 MG capsule, Take 1 capsule (40 mg total) by mouth as needed., Disp: 90 capsule, Rfl: 3 ?  OVER THE COUNTER MEDICATION, Vitamin D 600 mg, One capsule daily., Disp: , Rfl:  ?  Psyllium (METAMUCIL PO), Take by mouth., Disp: , Rfl:  ? ?Observations/Objective: ?Patient is well-developed,  well-nourished in no acute distress.  ?Resting comfortably  at home.  ?Head is normocephalic, atraumatic.  ?No labored breathing.  ?Speech is clear and coherent with logical content.  ?Patient is alert and oriented at baseline.  ? ? ?Assessment and Plan: ?1. Acute non-recurrent pansinusitis ? ?- amoxicillin-clavulanate (AUGMENTIN) 875-125 MG tablet; Take 1 tablet by mouth 2 (two) times daily for 7 days. Take with food  Dispense: 14 tablet; Refill: 0 ?   ?Continue Mucinex and allergy medication ? ?Rest and push fluids  ?Continue to take COVID test up to 5 days from exposure and follow up with any positive testing as discussed  ? ?Follow Up Instructions: ?I discussed the assessment and treatment plan with the patient. The patient was provided an opportunity to ask questions and all were answered. The patient agreed with the plan and demonstrated an understanding of the instructions.  A copy of instructions were sent to the patient via MyChart unless otherwise noted below.  ? ?The patient was advised to call back or seek an in-person evaluation if the symptoms worsen or if the condition fails to improve as anticipated. ? ?Time:  ?I spent 10 minutes with the patient via telehealth technology discussing the above problems/concerns.   ? ?Apolonio Schneiders, FNP  ?

## 2021-08-15 DIAGNOSIS — H25013 Cortical age-related cataract, bilateral: Secondary | ICD-10-CM | POA: Diagnosis not present

## 2021-11-11 ENCOUNTER — Telehealth: Payer: Medicare Other | Admitting: Physician Assistant

## 2021-11-11 DIAGNOSIS — J019 Acute sinusitis, unspecified: Secondary | ICD-10-CM

## 2021-11-11 DIAGNOSIS — B9689 Other specified bacterial agents as the cause of diseases classified elsewhere: Secondary | ICD-10-CM

## 2021-11-11 MED ORDER — DOXYCYCLINE HYCLATE 100 MG PO TABS: 100.0000 mg | ORAL_TABLET | Freq: Two times a day (BID) | ORAL | 0 refills | Status: DC

## 2021-11-11 NOTE — Patient Instructions (Signed)
Dana Cobb, thank you for joining [name], PA-C for today's virtual visit.  While this provider is not your primary care provider (PCP), if your PCP is located in our provider database this encounter information will be shared with them immediately following your visit.  Consent: (Patient) Dana Cobb provided verbal consent for this virtual visit at the beginning of the encounter.  Current Medications:  Current Outpatient Medications:    Cholecalciferol 50 MCG (2000 UT) TABS, 1 tab by mouth once daily, Disp: 30 tablet, Rfl: 11   clotrimazole-betamethasone (LOTRISONE) cream, Apply 1 application topically 2 (two) times daily., Disp: 30 g, Rfl: 1   MAGNESIUM PO, Take by mouth daily. , Disp: , Rfl:    Multiple Vitamins-Minerals (ICAPS AREDS FORMULA PO), Take by mouth., Disp: , Rfl:    omeprazole (PRILOSEC) 40 MG capsule, Take 1 capsule (40 mg total) by mouth as needed., Disp: 90 capsule, Rfl: 3   OVER THE COUNTER MEDICATION, Vitamin D 600 mg, One capsule daily., Disp: , Rfl:    Psyllium (METAMUCIL PO), Take by mouth., Disp: , Rfl:    Medications ordered in this encounter:  No orders of the defined types were placed in this encounter.    *If you need refills on other medications prior to your next appointment, please contact your pharmacy*  Follow-Up: Call back or seek an in-person evaluation if the symptoms worsen or if the condition fails to improve as anticipated.  Other Instructions Please take antibiotic as directed.  Increase fluid intake.  Use Saline nasal spray.  Take a daily multivitamin. Ok to continue the OTC medications you have been taking.  Place a humidifier in the bedroom.  Please call or return clinic if symptoms are not improving.  Sinusitis Sinusitis is redness, soreness, and swelling (inflammation) of the paranasal sinuses. Paranasal sinuses are air pockets within the bones of your face (beneath the eyes, the middle of the forehead, or  above the eyes). In healthy paranasal sinuses, mucus is able to drain out, and air is able to circulate through them by way of your nose. However, when your paranasal sinuses are inflamed, mucus and air can become trapped. This can allow bacteria and other germs to grow and cause infection. Sinusitis can develop quickly and last only a short time (acute) or continue over a long period (chronic). Sinusitis that lasts for more than 12 weeks is considered chronic.  CAUSES  Causes of sinusitis include: Allergies. Structural abnormalities, such as displacement of the cartilage that separates your nostrils (deviated septum), which can decrease the air flow through your nose and sinuses and affect sinus drainage. Functional abnormalities, such as when the small hairs (cilia) that line your sinuses and help remove mucus do not work properly or are not present. SYMPTOMS  Symptoms of acute and chronic sinusitis are the same. The primary symptoms are pain and pressure around the affected sinuses. Other symptoms include: Upper toothache. Earache. Headache. Bad breath. Decreased sense of smell and taste. A cough, which worsens when you are lying flat. Fatigue. Fever. Thick drainage from your nose, which often is green and may contain pus (purulent). Swelling and warmth over the affected sinuses. DIAGNOSIS  Your caregiver will perform a physical exam. During the exam, your caregiver may: Look in your nose for signs of abnormal growths in your nostrils (nasal polyps). Tap over the affected sinus to check for signs of infection. View the inside of your sinuses (endoscopy) with a special imaging device with a light  attached (endoscope), which is inserted into your sinuses. If your caregiver suspects that you have chronic sinusitis, one or more of the following tests may be recommended: Allergy tests. Nasal culture A sample of mucus is taken from your nose and sent to a lab and screened for bacteria. Nasal  cytology A sample of mucus is taken from your nose and examined by your caregiver to determine if your sinusitis is related to an allergy. TREATMENT  Most cases of acute sinusitis are related to a viral infection and will resolve on their own within 10 days. Sometimes medicines are prescribed to help relieve symptoms (pain medicine, decongestants, nasal steroid sprays, or saline sprays).  However, for sinusitis related to a bacterial infection, your caregiver will prescribe antibiotic medicines. These are medicines that will help kill the bacteria causing the infection.  Rarely, sinusitis is caused by a fungal infection. In theses cases, your caregiver will prescribe antifungal medicine. For some cases of chronic sinusitis, surgery is needed. Generally, these are cases in which sinusitis recurs more than 3 times per year, despite other treatments. HOME CARE INSTRUCTIONS  Drink plenty of water. Water helps thin the mucus so your sinuses can drain more easily. Use a humidifier. Inhale steam 3 to 4 times a day (for example, sit in the bathroom with the shower running). Apply a warm, moist washcloth to your face 3 to 4 times a day, or as directed by your caregiver. Use saline nasal sprays to help moisten and clean your sinuses. Take over-the-counter or prescription medicines for pain, discomfort, or fever only as directed by your caregiver. SEEK IMMEDIATE MEDICAL CARE IF: You have increasing pain or severe headaches. You have nausea, vomiting, or drowsiness. You have swelling around your face. You have vision problems. You have a stiff neck. You have difficulty breathing. MAKE SURE YOU:  Understand these instructions. Will watch your condition. Will get help right away if you are not doing well or get worse. Document Released: 04/06/2005 Document Revised: 06/29/2011 Document Reviewed: 04/21/2011 [facility] Patient Information 2014 [place], [place].   If you have been instructed to have an  in-person evaluation today at a local Urgent Care facility, please use the link below. It will take you to a list of all of our available [facility], including address, phone number and hours of operation. Please do not delay care.  [facility]  If you or a family member do not have a primary care provider, use the link below to schedule a visit and establish care. When you choose a [facility] primary care physician or advanced practice provider, you gain a long-term partner in health. Find a Primary Care Provider  Learn more about [facility]'s in-office and virtual care options: [facility] - Get Care Now

## 2021-11-11 NOTE — Progress Notes (Signed)
Virtual Visit Consent   [name], you are scheduled for a virtual visit with a [facility] provider today. Just as with appointments in the office, your consent must be obtained to participate. Your consent will be active for this visit and any virtual visit you may have with one of our providers in the next 365 days. If you have a MyChart account, a copy of this consent can be sent to you electronically.  As this is a virtual visit, video technology does not allow for your provider to perform a traditional examination. This may limit your provider's ability to fully assess your condition. If your provider identifies any concerns that need to be evaluated in person or the need to arrange testing (such as labs, EKG, etc.), we will make arrangements to do so. Although advances in technology are sophisticated, we cannot ensure that it will always work on either your end or our end. If the connection with a video visit is poor, the visit may have to be switched to a telephone visit. With either a video or telephone visit, we are not always able to ensure that we have a secure connection.  By engaging in this virtual visit, you consent to the provision of healthcare and authorize for your insurance to be billed (if applicable) for the services provided during this visit. Depending on your insurance coverage, you may receive a charge related to this service.  I need to obtain your verbal consent now. Are you willing to proceed with your visit today? [name] has provided verbal consent on 11/11/2021 for a virtual visit (video or telephone). Dana Cobb, [place]  Date: 11/11/2021 5:41 PM  Virtual Visit via Video Note   I, Dana Cobb, connected with  Dana Cobb  ([id number], June 10, 1950) on 11/11/21 at  6:00 PM EDT by a video-enabled telemedicine application and verified that I am speaking with the correct person using two identifiers.  Location: Patient: Virtual  Visit Location Patient: Home Provider: Virtual Visit Location Provider: Home Office   I discussed the limitations of evaluation and management by telemedicine and the availability of in person appointments. The patient expressed understanding and agreed to proceed.    History of Present Illness: Dana Cobb is a 71 y.o. who identifies as a female who was assigned female at birth, and is being seen today for possible sinusitis. Notes symptoms starting about 10 days ago with nasal and head congestion, sinus pressure and facial/sinus pain. Also noting ear pressure and sinus headache. Nasal drainage is now thick and yellow. Denies fever. Has been trying to treat it herself by using Mucinex 1200 mg BID, OTC Chlor-Trimeton 4 mg.   HPI: HPI  Problems:  Patient Active Problem List   Diagnosis Date Noted   URI (upper respiratory infection) 02/26/2021   Blood pressure elevated without history of HTN 01/20/2021   Vitamin D deficiency 03/05/2020   GERD (gastroesophageal reflux disease) 09/24/2017   Colon polyps 09/24/2017   Osteopenia 09/24/2017   Allergic rhinitis    Hyperlipidemia     Allergies:  Allergies  Allergen Reactions   Codeine Anaphylaxis   Lipitor [Atorvastatin] Other (See Comments)    myalgias   Medications:  Current Outpatient Medications:    doxycycline (VIBRA-TABS) 100 MG tablet, Take 1 tablet (100 mg total) by mouth 2 (two) times daily., Disp: 20 tablet, Rfl: 0   MAGNESIUM PO, Take by mouth daily. , Disp: , Rfl:    Multiple Vitamins-Minerals (ICAPS AREDS FORMULA  PO), Take by mouth., Disp: , Rfl:    omeprazole (PRILOSEC) 40 MG capsule, Take 1 capsule (40 mg total) by mouth as needed., Disp: 90 capsule, Rfl: 3   OVER THE COUNTER MEDICATION, Vitamin D 600 mg, One capsule daily., Disp: , Rfl:    Psyllium (METAMUCIL PO), Take by mouth., Disp: , Rfl:   Observations/Objective: Patient is well-developed, well-nourished in no acute distress.  Resting comfortably at home.   Head is normocephalic, atraumatic.  No labored breathing. Speech is clear and coherent with logical content.  Patient is alert and oriented at baseline.   Assessment and Plan: 1. Acute bacterial sinusitis - doxycycline (VIBRA-TABS) 100 MG tablet; Take 1 tablet (100 mg total) by mouth 2 (two) times daily.  Dispense: 20 tablet; Refill: 0  Rx Doxycycline.  Increase fluids.  Rest.  Saline nasal spray.  Probiotic.  Mucinex as directed.  Humidifier in bedroom.  Call or return to clinic if symptoms are not improving.   Follow Up Instructions: I discussed the assessment and treatment plan with the patient. The patient was provided an opportunity to ask questions and all were answered. The patient agreed with the plan and demonstrated an understanding of the instructions.  A copy of instructions were sent to the patient via MyChart unless otherwise noted below.   The patient was advised to call back or seek an in-person evaluation if the symptoms worsen or if the condition fails to improve as anticipated.  Time:  I spent 10 minutes with the patient via telehealth technology discussing the above problems/concerns.    Dana Climes, PA-C

## 2021-12-30 DIAGNOSIS — H40053 Ocular hypertension, bilateral: Secondary | ICD-10-CM | POA: Diagnosis not present

## 2022-01-05 DIAGNOSIS — Z23 Encounter for immunization: Secondary | ICD-10-CM | POA: Diagnosis not present

## 2022-02-19 DIAGNOSIS — J343 Hypertrophy of nasal turbinates: Secondary | ICD-10-CM | POA: Diagnosis not present

## 2022-02-19 DIAGNOSIS — J310 Chronic rhinitis: Secondary | ICD-10-CM | POA: Diagnosis not present

## 2022-02-19 DIAGNOSIS — J320 Chronic maxillary sinusitis: Secondary | ICD-10-CM | POA: Diagnosis not present

## 2022-02-19 DIAGNOSIS — H903 Sensorineural hearing loss, bilateral: Secondary | ICD-10-CM | POA: Diagnosis not present

## 2022-02-19 DIAGNOSIS — H838X3 Other specified diseases of inner ear, bilateral: Secondary | ICD-10-CM | POA: Diagnosis not present

## 2022-05-08 ENCOUNTER — Other Ambulatory Visit: Payer: Self-pay | Admitting: Internal Medicine

## 2022-05-08 DIAGNOSIS — Z1231 Encounter for screening mammogram for malignant neoplasm of breast: Secondary | ICD-10-CM

## 2022-06-05 ENCOUNTER — Other Ambulatory Visit: Payer: Self-pay | Admitting: Otolaryngology

## 2022-06-05 DIAGNOSIS — J343 Hypertrophy of nasal turbinates: Secondary | ICD-10-CM | POA: Diagnosis not present

## 2022-06-05 DIAGNOSIS — J329 Chronic sinusitis, unspecified: Secondary | ICD-10-CM

## 2022-06-05 DIAGNOSIS — J324 Chronic pansinusitis: Secondary | ICD-10-CM | POA: Diagnosis not present

## 2022-06-25 ENCOUNTER — Ambulatory Visit
Admission: RE | Admit: 2022-06-25 | Discharge: 2022-06-25 | Disposition: A | Discharge status: Home / Self Care | Payer: Medicare Other | Source: Ambulatory Visit | Attending: Internal Medicine | Admitting: Internal Medicine

## 2022-06-25 DIAGNOSIS — Z1231 Encounter for screening mammogram for malignant neoplasm of breast: Secondary | ICD-10-CM | POA: Diagnosis not present

## 2022-07-06 ENCOUNTER — Ambulatory Visit
Admission: RE | Admit: 2022-07-06 | Discharge: 2022-07-06 | Disposition: A | Discharge status: Home / Self Care | Payer: Medicare Other | Source: Ambulatory Visit | Attending: Otolaryngology | Admitting: Otolaryngology

## 2022-07-06 DIAGNOSIS — J329 Chronic sinusitis, unspecified: Secondary | ICD-10-CM | POA: Diagnosis not present

## 2022-07-28 DIAGNOSIS — J343 Hypertrophy of nasal turbinates: Secondary | ICD-10-CM | POA: Diagnosis not present

## 2022-07-28 DIAGNOSIS — J310 Chronic rhinitis: Secondary | ICD-10-CM | POA: Diagnosis not present

## 2022-08-18 DIAGNOSIS — H40053 Ocular hypertension, bilateral: Secondary | ICD-10-CM | POA: Diagnosis not present

## 2022-09-03 DIAGNOSIS — J343 Hypertrophy of nasal turbinates: Secondary | ICD-10-CM | POA: Diagnosis not present

## 2022-09-03 DIAGNOSIS — J3489 Other specified disorders of nose and nasal sinuses: Secondary | ICD-10-CM | POA: Diagnosis not present

## 2023-01-16 DIAGNOSIS — Z23 Encounter for immunization: Secondary | ICD-10-CM | POA: Diagnosis not present

## 2023-02-08 ENCOUNTER — Ambulatory Visit (AMBULATORY_SURGERY_CENTER): Payer: Medicare Other

## 2023-02-08 VITALS — Ht 63.0 in | Wt 178.0 lb

## 2023-02-08 DIAGNOSIS — Z800 Family history of malignant neoplasm of digestive organs: Secondary | ICD-10-CM

## 2023-02-08 MED ORDER — NA SULFATE-K SULFATE-MG SULF 17.5-3.13-1.6 GM/177ML PO SOLN: 1.0000 | Freq: Once | ORAL | 0 refills | Status: AC

## 2023-02-08 NOTE — Progress Notes (Addendum)
No egg or soy allergy known to patient  No issues known to pt with past sedation with any surgeries or procedures Patient denies ever being told they had issues or difficulty with intubation  No FH of Malignant Hyperthermia Pt is not on diet pills Pt is not on  home 02  Pt is not on blood thinners  Pt denies issues with constipation  No A fib or A flutter Have any cardiac testing pending--no LOA: independent  Prep: suprep  PV competed with patient. Prep instructions sent via mychart and home address. Goodrx coupon for [employer] provided to use for price reduction if needed.

## 2023-02-10 DIAGNOSIS — H02834 Dermatochalasis of left upper eyelid: Secondary | ICD-10-CM | POA: Diagnosis not present

## 2023-02-10 DIAGNOSIS — H02831 Dermatochalasis of right upper eyelid: Secondary | ICD-10-CM | POA: Diagnosis not present

## 2023-03-05 ENCOUNTER — Telehealth: Payer: Self-pay | Admitting: Gastroenterology

## 2023-03-05 NOTE — Telephone Encounter (Signed)
Called pt and she stated that she has been having some chest congestion, and a cough. Pt denies fever, or chills. Has been taking mucinex for current symptoms. Spoke with Charlsie Merles, CRNA and advised pt that she can take the mucinex over the weekend. Advised pt that if she was to develop a fever or if symptoms get worse to call and we can reschedule for when she feels better. Pt verbalized understanding and had no further concerns at the end of the call.

## 2023-03-05 NOTE — Telephone Encounter (Signed)
Inbound call from patient, states she has come down with a cold and has been taking Mucinex daily. Patient would like to know if she can continue taking the Mucinex prior to her procedure on 11/18 at 2:00 PM, or  when she needs to stop taking it. Would like to speak with a nurse in regards.   Thank you.

## 2023-03-08 ENCOUNTER — Ambulatory Visit: Payer: Medicare Other | Admitting: Gastroenterology

## 2023-03-08 ENCOUNTER — Encounter: Payer: Self-pay | Admitting: Gastroenterology

## 2023-03-08 VITALS — BP 128/70 | HR 60 | Temp 97.2°F | Resp 11 | Ht 63.0 in | Wt 178.0 lb

## 2023-03-08 DIAGNOSIS — Z1211 Encounter for screening for malignant neoplasm of colon: Secondary | ICD-10-CM | POA: Diagnosis not present

## 2023-03-08 DIAGNOSIS — D120 Benign neoplasm of cecum: Secondary | ICD-10-CM

## 2023-03-08 DIAGNOSIS — D122 Benign neoplasm of ascending colon: Secondary | ICD-10-CM

## 2023-03-08 DIAGNOSIS — Z800 Family history of malignant neoplasm of digestive organs: Secondary | ICD-10-CM | POA: Diagnosis not present

## 2023-03-08 DIAGNOSIS — E785 Hyperlipidemia, unspecified: Secondary | ICD-10-CM | POA: Diagnosis not present

## 2023-03-08 MED ORDER — SODIUM CHLORIDE 0.9 % IV SOLN: 500.0000 mL | Freq: Once | INTRAVENOUS | Status: DC

## 2023-03-08 NOTE — Progress Notes (Signed)
Pt's states no medical or surgical changes since previsit or office visit. 

## 2023-03-08 NOTE — Patient Instructions (Addendum)
Resume previous diet Continue present medications Await pathology results Repeat colonoscopy in 5 years, consume more water the day before and with the prep with next colonscopy  Handouts/information given for polyps, diverticulosis and hemorrhoids  YOU HAD AN ENDOSCOPIC PROCEDURE TODAY AT THE [facility]:   Refer to the procedure report that was given to you for any specific questions about what was found during the examination.  If the procedure report does not answer your questions, please call your gastroenterologist to clarify.  If you requested that your care partner not be given the details of your procedure findings, then the procedure report has been included in a sealed envelope for you to review at your convenience later.  YOU SHOULD EXPECT: Some feelings of bloating in the abdomen. Passage of more gas than usual.  Walking can help get rid of the air that was put into your GI tract during the procedure and reduce the bloating. If you had a lower endoscopy (such as a colonoscopy or flexible sigmoidoscopy) you may notice spotting of blood in your stool or on the toilet paper. If you underwent a bowel prep for your procedure, you may not have a normal bowel movement for a few days.  Please Note:  You might notice some irritation and congestion in your nose or some drainage.  This is from the oxygen used during your procedure.  There is no need for concern and it should clear up in a day or so.  SYMPTOMS TO REPORT IMMEDIATELY:  Following lower endoscopy (colonoscopy):  Excessive amounts of blood in the stool  Significant tenderness or worsening of abdominal pains  Swelling of the abdomen that is new, acute  Fever of 100F or higher  For urgent or emergent issues, a gastroenterologist can be reached at any hour by calling [phone]. Do not use MyChart messaging for urgent concerns.   DIET:  We do recommend a small meal at first, but then you may proceed to your regular  diet.  Drink plenty of fluids but you should avoid alcoholic beverages for 24 hours.  ACTIVITY:  You should plan to take it easy for the rest of today and you should NOT DRIVE or use heavy machinery until tomorrow (because of the sedation medicines used during the test).    FOLLOW UP: Our staff will call the number listed on your records the next business day following your procedure.  We will call around 7:15- 8:00 am to check on you and address any questions or concerns that you may have regarding the information given to you following your procedure. If we do not reach you, we will leave a message.     If any biopsies were taken you will be contacted by phone or by letter within the next 1-3 weeks.  Please call us at [phone] if you have not heard about the biopsies in 3 weeks.   SIGNATURES/CONFIDENTIALITY: You and/or your care partner have signed paperwork which will be entered into your electronic medical record.  These signatures attest to the fact that that the information above on your After Visit Summary has been reviewed and is understood.  Full responsibility of the confidentiality of this discharge information lies with you and/or your care-partner.

## 2023-03-08 NOTE — Op Note (Signed)
[facility] Patient Name: Dana Cobb Procedure Date: 03/08/2023 2:32 PM MRN: [id number] Endoscopist: Sherilyn Cooter L. Myrtie Neither , MD, [id number] Age: 72 Referring MD:  Date of Birth: 1950-05-31 Gender: Female Account #: [id number] Procedure:                Colonoscopy Indications:              Colon cancer screening in patient at increased                            risk: Colorectal cancer in mother Medicines:                Monitored Anesthesia Care Procedure:                Pre-Anesthesia Assessment:                           - Prior to the procedure, a History and Physical                            was performed, and patient medications and                            allergies were reviewed. The patient's tolerance of                            previous anesthesia was also reviewed. The risks                            and benefits of the procedure and the sedation                            options and risks were discussed with the patient.                            All questions were answered, and informed consent                            was obtained. Prior Anticoagulants: The patient has                            taken no anticoagulant or antiplatelet agents. ASA                            Grade Assessment: II - A patient with mild systemic                            disease. After reviewing the risks and benefits,                            the patient was deemed in satisfactory condition to                            undergo the procedure.  After obtaining informed consent, the colonoscope                            was passed under direct vision. Throughout the                            procedure, the patient's blood pressure, pulse, and                            oxygen saturations were monitored continuously. The                            Olympus Scope SN: [id number] was introduced through                            the anus and advanced  to the the cecum, identified                            by appendiceal orifice and ileocecal valve. The                            colonoscopy was somewhat difficult due to a                            redundant colon and significant looping. Successful                            completion of the procedure was aided by using                            manual pressure and straightening and shortening                            the scope to obtain bowel loop reduction. The                            patient tolerated the procedure well. The quality                            of the bowel preparation was good after lavage. The                            ileocecal valve, appendiceal orifice, and rectum                            were photographed. The bowel preparation used was                            SUPREP via split dose instruction. Scope In: 2:45:24 PM Scope Out: 3:07:31 PM Scope Withdrawal Time: 0 hours 14 minutes 30 seconds  Total Procedure Duration: 0 hours 22 minutes 7 seconds  Findings:                 The perianal and digital  rectal examinations were                            normal.                           Repeat examination of right colon under NBI                            performed.                           Two sessile polyps were found in the ascending                            colon and cecum. The polyps were diminutive in                            size. These polyps were removed with a cold snare.                            Resection and retrieval were complete.                           Multiple diverticula were found in the left colon                            and right colon.                           The colon (entire examined portion) was redundant.                           Internal hemorrhoids were found. The hemorrhoids                            were small.                           The exam was otherwise without abnormality on                             direct and retroflexion views. Complications:            No immediate complications. Estimated Blood Loss:     Estimated blood loss was minimal. Impression:               - Two diminutive polyps in the ascending colon and                            in the cecum, removed with a cold snare. Resected                            and retrieved.                           - Diverticulosis in the left colon  and in the right                            colon.                           - Redundant colon.                           - Internal hemorrhoids.                           - The examination was otherwise normal on direct                            and retroflexion views. Recommendation:           - Patient has a contact number available for                            emergencies. The signs and symptoms of potential                            delayed complications were discussed with the                            patient. Return to normal activities tomorrow.                            Written discharge instructions were provided to the                            patient.                           - Resume previous diet.                           - Continue present medications.                           - Await pathology results.                           - Repeat colonoscopy in 5 years for surveillance                            and family history. (Consume more water with prep                            for next exam) Sherilyn Cooter L. Myrtie Neither, MD 03/08/2023 3:11:27 PM This report has been signed electronically.

## 2023-03-08 NOTE — Progress Notes (Signed)
History and Physical:  This patient presents for endoscopic testing for: Encounter Diagnosis  Name Primary?   Family history of malignant neoplasm of gastrointestinal tract Yes    Screening exam today for Family Hx CRC (mother) No polyps Oct 2019. Patient denies chronic abdominal pain, rectal bleeding, constipation or diarrhea.  Patient is otherwise without complaints or active issues today.   Past Medical History: Past Medical History:  Diagnosis Date   Allergic rhinitis    Allergy    Cataract    Colon polyps 09/24/2017   GERD (gastroesophageal reflux disease) 09/24/2017   Hyperlipidemia    Osteopenia 09/24/2017     Past Surgical History: Past Surgical History:  Procedure Laterality Date   BUNIONECTOMY Left    COLONOSCOPY     Gartner duct cyst     HAMMER TOE SURGERY Left    TONSILLECTOMY AND ADENOIDECTOMY     TURBINATE REDUCTION Bilateral    UPPER GASTROINTESTINAL ENDOSCOPY     VAGINAL HYSTERECTOMY     WISDOM TOOTH EXTRACTION      Allergies: Allergies  Allergen Reactions   Codeine Anaphylaxis   Lipitor [Atorvastatin] Other (See Comments)    myalgias    Outpatient Meds: Current Outpatient Medications  Medication Sig Dispense Refill   guaiFENesin (MUCINEX PO) Take by mouth as needed.     MAGNESIUM PO Take by mouth daily.      Multiple Vitamins-Minerals (ICAPS AREDS FORMULA PO) Take by mouth.     omeprazole (PRILOSEC) 40 MG capsule Take 1 capsule (40 mg total) by mouth as needed. 90 capsule 3   OVER THE COUNTER MEDICATION Vitamin D 600 mg, One capsule daily.     Psyllium (METAMUCIL PO) Take by mouth as needed.     Current Facility-Administered Medications  Medication Dose Route Frequency Provider Last Rate Last Admin   0.9 %  sodium chloride infusion  500 mL Intravenous Once Charlie Pitter III, MD          ___________________________________________________________________ Objective   Exam:  BP (!) 142/77   Pulse 67   Temp (!) 97.2 F (36.2 C)   Ht  5\' 3"  (1.6 m)   Wt 178 lb (80.7 kg)   SpO2 98%   BMI 31.53 kg/m   CV: regular , S1/S2 Resp: clear to auscultation bilaterally, normal RR and effort noted GI: soft, no tenderness, with active bowel sounds.   Assessment: Encounter Diagnosis  Name Primary?   Family history of malignant neoplasm of gastrointestinal tract Yes     Plan: Colonoscopy  The benefits and risks of the planned procedure were described in detail with the patient or (when appropriate) their health care proxy.  Risks were outlined as including, but not limited to, bleeding, infection, perforation, adverse medication reaction leading to cardiac or pulmonary decompensation, pancreatitis (if ERCP).  The limitation of incomplete mucosal visualization was also discussed.  No guarantees or warranties were given.  The patient is appropriate for an endoscopic procedure in the ambulatory setting.   - Amada Jupiter, MD

## 2023-03-08 NOTE — Progress Notes (Signed)
Sedate, gd SR, tolerated procedure well, VSS, report to RN 

## 2023-03-08 NOTE — Progress Notes (Signed)
Called to room to assist during endoscopic procedure.  Patient ID and intended procedure confirmed with present staff. Received instructions for my participation in the procedure from the performing physician.  

## 2023-03-09 ENCOUNTER — Telehealth: Payer: Self-pay | Admitting: *Deleted

## 2023-03-09 NOTE — Telephone Encounter (Signed)
  Follow up Call-     03/08/2023    1:50 PM  Call back number  Post procedure Call Back phone  # [phone]  Permission to leave phone message Yes   [facility]

## 2023-03-11 ENCOUNTER — Encounter: Payer: Medicare Other | Admitting: Gastroenterology

## 2023-03-11 LAB — SURGICAL PATHOLOGY

## 2023-03-12 ENCOUNTER — Encounter: Payer: Self-pay | Admitting: Gastroenterology

## 2023-03-17 DIAGNOSIS — H04123 Dry eye syndrome of bilateral lacrimal glands: Secondary | ICD-10-CM | POA: Diagnosis not present

## 2023-03-17 DIAGNOSIS — H02831 Dermatochalasis of right upper eyelid: Secondary | ICD-10-CM | POA: Diagnosis not present

## 2023-03-17 DIAGNOSIS — H02421 Myogenic ptosis of right eyelid: Secondary | ICD-10-CM | POA: Diagnosis not present

## 2023-03-17 DIAGNOSIS — H57813 Brow ptosis, bilateral: Secondary | ICD-10-CM | POA: Diagnosis not present

## 2023-03-17 DIAGNOSIS — G245 Blepharospasm: Secondary | ICD-10-CM | POA: Diagnosis not present

## 2023-03-17 DIAGNOSIS — H53483 Generalized contraction of visual field, bilateral: Secondary | ICD-10-CM | POA: Diagnosis not present

## 2023-03-17 DIAGNOSIS — H02834 Dermatochalasis of left upper eyelid: Secondary | ICD-10-CM | POA: Diagnosis not present

## 2023-03-17 DIAGNOSIS — H5702 Anisocoria: Secondary | ICD-10-CM | POA: Diagnosis not present

## 2023-03-17 DIAGNOSIS — H0279 Other degenerative disorders of eyelid and periocular area: Secondary | ICD-10-CM | POA: Diagnosis not present

## 2023-03-29 ENCOUNTER — Ambulatory Visit (INDEPENDENT_AMBULATORY_CARE_PROVIDER_SITE_OTHER): Payer: Medicare Other

## 2023-03-29 ENCOUNTER — Encounter (INDEPENDENT_AMBULATORY_CARE_PROVIDER_SITE_OTHER): Payer: Self-pay

## 2023-03-29 VITALS — Ht 63.0 in | Wt 179.0 lb

## 2023-03-29 DIAGNOSIS — R0981 Nasal congestion: Secondary | ICD-10-CM

## 2023-03-29 DIAGNOSIS — J310 Chronic rhinitis: Secondary | ICD-10-CM

## 2023-03-29 DIAGNOSIS — J343 Hypertrophy of nasal turbinates: Secondary | ICD-10-CM

## 2023-03-30 DIAGNOSIS — J343 Hypertrophy of nasal turbinates: Secondary | ICD-10-CM | POA: Insufficient documentation

## 2023-03-30 DIAGNOSIS — J310 Chronic rhinitis: Secondary | ICD-10-CM | POA: Insufficient documentation

## 2023-03-30 NOTE — Progress Notes (Signed)
Patient ID: Dana Cobb, female   DOB: [birth year], 72 y.o.   MRN: [id number]  Follow-up: Chronic nasal congestion  HPI: The patient is a 72 year old female who returns today for her follow-up evaluation.  The patient was previously seen for chronic nasal congestion, secondary to bilateral inferior turbinate hypertrophy.  She was treated with bilateral inferior turbinate reduction in May 2024.  The patient returns today reporting significant improvement in her nasal breathing.  She is able to breathe through both nostrils.  She has only noted mild nasal congestion during the allergy seasons.  Currently she denies any facial pain, fever, or visual change.  Exam: General: Communicates without difficulty, well nourished, no acute distress. Head: Normocephalic, no evidence injury, no tenderness, facial buttresses intact without stepoff. Face/sinus: No tenderness to palpation and percussion. Facial movement is normal and symmetric. Eyes: PERRL, EOMI. No scleral icterus, conjunctivae clear. Neuro: CN II exam reveals vision grossly intact.  No nystagmus at any point of gaze. Ears: Auricles well formed without lesions.  Ear canals are intact without mass or lesion.  No erythema or edema is appreciated.  The TMs are intact without fluid. Nose: External evaluation reveals normal support and skin without lesions.  Dorsum is intact.  Anterior rhinoscopy reveals mildly congested mucosa over anterior aspect of inferior turbinates and intact septum.  No purulence noted. Oral:  Oral cavity and oropharynx are intact, symmetric, without erythema or edema.  Mucosa is moist without lesions. Neck: Full range of motion without pain.  There is no significant lymphadenopathy.  No masses palpable.  Thyroid bed within normal limits to palpation.  Parotid glands and submandibular glands equal bilaterally without mass.  Trachea is midline. Neuro:  CN 2-12 grossly intact.   Assessment: 1.  Chronic rhinitis with mild nasal mucosal  congestion. 2.  The patient's turbinates are well-healed.  Her nasal passageways are patent bilaterally.  Plan: 1.  The physical exam findings are reviewed with the patient. 2.  Nasal saline irrigation and Flonase nasal spray as needed. 3.  The patient is encouraged to call with any questions or concerns.

## 2023-05-06 DIAGNOSIS — G902 Horner's syndrome: Secondary | ICD-10-CM | POA: Diagnosis not present

## 2023-05-06 DIAGNOSIS — H02832 Dermatochalasis of right lower eyelid: Secondary | ICD-10-CM | POA: Diagnosis not present

## 2023-05-06 DIAGNOSIS — H02834 Dermatochalasis of left upper eyelid: Secondary | ICD-10-CM | POA: Diagnosis not present

## 2023-05-06 DIAGNOSIS — H02831 Dermatochalasis of right upper eyelid: Secondary | ICD-10-CM | POA: Diagnosis not present

## 2023-05-07 DIAGNOSIS — H5702 Anisocoria: Secondary | ICD-10-CM | POA: Diagnosis not present

## 2023-05-07 DIAGNOSIS — H02403 Unspecified ptosis of bilateral eyelids: Secondary | ICD-10-CM | POA: Diagnosis not present

## 2023-05-11 ENCOUNTER — Encounter: Payer: Self-pay | Admitting: Emergency Medicine

## 2023-05-12 ENCOUNTER — Other Ambulatory Visit: Payer: Self-pay | Admitting: Ophthalmology

## 2023-05-12 DIAGNOSIS — G902 Horner's syndrome: Secondary | ICD-10-CM

## 2023-05-12 DIAGNOSIS — S158XXS Injury of other specified blood vessels at neck level, sequela: Secondary | ICD-10-CM

## 2023-05-25 ENCOUNTER — Ambulatory Visit
Admission: RE | Admit: 2023-05-25 | Discharge: 2023-05-25 | Disposition: A | Discharge status: Home / Self Care | Payer: Self-pay | Source: Ambulatory Visit | Attending: Ophthalmology | Admitting: Ophthalmology

## 2023-05-25 DIAGNOSIS — G902 Horner's syndrome: Secondary | ICD-10-CM | POA: Diagnosis not present

## 2023-05-25 DIAGNOSIS — S158XXS Injury of other specified blood vessels at neck level, sequela: Secondary | ICD-10-CM

## 2023-05-25 MED ORDER — GADOPICLENOL 0.5 MMOL/ML IV SOLN
8.0000 mL | Freq: Once | INTRAVENOUS | Status: AC | PRN
  Administered 2023-05-25: 8 mL via INTRAVENOUS

## 2023-05-28 ENCOUNTER — Ambulatory Visit
Admission: RE | Admit: 2023-05-28 | Discharge: 2023-05-28 | Disposition: A | Discharge status: Home / Self Care | Payer: Medicare Other | Source: Ambulatory Visit | Attending: Ophthalmology | Admitting: Ophthalmology

## 2023-05-28 ENCOUNTER — Other Ambulatory Visit: Payer: Self-pay | Admitting: Ophthalmology

## 2023-05-28 DIAGNOSIS — G902 Horner's syndrome: Secondary | ICD-10-CM | POA: Diagnosis not present

## 2023-06-04 ENCOUNTER — Encounter: Payer: Self-pay | Admitting: Ophthalmology

## 2023-06-04 DIAGNOSIS — H02403 Unspecified ptosis of bilateral eyelids: Secondary | ICD-10-CM | POA: Diagnosis not present

## 2023-06-04 DIAGNOSIS — H5702 Anisocoria: Secondary | ICD-10-CM | POA: Diagnosis not present

## 2023-06-28 ENCOUNTER — Ambulatory Visit
Admission: RE | Admit: 2023-06-28 | Discharge: 2023-06-28 | Disposition: A | Discharge status: Home / Self Care | Payer: Medicare Other | Source: Ambulatory Visit | Attending: Internal Medicine | Admitting: Internal Medicine

## 2023-06-28 DIAGNOSIS — Z1231 Encounter for screening mammogram for malignant neoplasm of breast: Secondary | ICD-10-CM

## 2023-07-28 DIAGNOSIS — H16143 Punctate keratitis, bilateral: Secondary | ICD-10-CM | POA: Diagnosis not present

## 2023-08-30 DIAGNOSIS — Z20822 Contact with and (suspected) exposure to covid-19: Secondary | ICD-10-CM | POA: Diagnosis not present

## 2023-08-30 DIAGNOSIS — J180 Bronchopneumonia, unspecified organism: Secondary | ICD-10-CM | POA: Diagnosis not present

## 2023-08-30 DIAGNOSIS — R059 Cough, unspecified: Secondary | ICD-10-CM | POA: Diagnosis not present

## 2023-09-03 DIAGNOSIS — J180 Bronchopneumonia, unspecified organism: Secondary | ICD-10-CM | POA: Diagnosis not present

## 2023-09-10 DIAGNOSIS — H40053 Ocular hypertension, bilateral: Secondary | ICD-10-CM | POA: Diagnosis not present

## 2023-09-16 DIAGNOSIS — H26493 Other secondary cataract, bilateral: Secondary | ICD-10-CM | POA: Diagnosis not present

## 2023-10-01 DIAGNOSIS — Z0000 Encounter for general adult medical examination without abnormal findings: Secondary | ICD-10-CM | POA: Diagnosis not present

## 2023-10-01 DIAGNOSIS — N949 Unspecified condition associated with female genital organs and menstrual cycle: Secondary | ICD-10-CM | POA: Diagnosis not present

## 2023-10-01 DIAGNOSIS — Z1329 Encounter for screening for other suspected endocrine disorder: Secondary | ICD-10-CM | POA: Diagnosis not present

## 2023-10-20 DIAGNOSIS — H26492 Other secondary cataract, left eye: Secondary | ICD-10-CM | POA: Diagnosis not present

## 2023-10-27 DIAGNOSIS — Z09 Encounter for follow-up examination after completed treatment for conditions other than malignant neoplasm: Secondary | ICD-10-CM | POA: Diagnosis not present

## 2023-10-27 DIAGNOSIS — H26491 Other secondary cataract, right eye: Secondary | ICD-10-CM | POA: Diagnosis not present

## 2023-11-07 IMAGING — MG MM DIGITAL SCREENING BILAT W/ TOMO AND CAD
8 series · 8 of 24 positions shown · non-contrast
Comparison: Previous exam(s).

CLINICAL DATA: Screening.

EXAM:
DIGITAL SCREENING BILATERAL MAMMOGRAM WITH TOMOSYNTHESIS AND CAD
TECHNIQUE: Bilateral screening digital craniocaudal and mediolateral oblique
mammograms were obtained. Bilateral screening digital breast
tomosynthesis was performed. The images were evaluated with
computer-aided detection.

[L CC synth-2D]
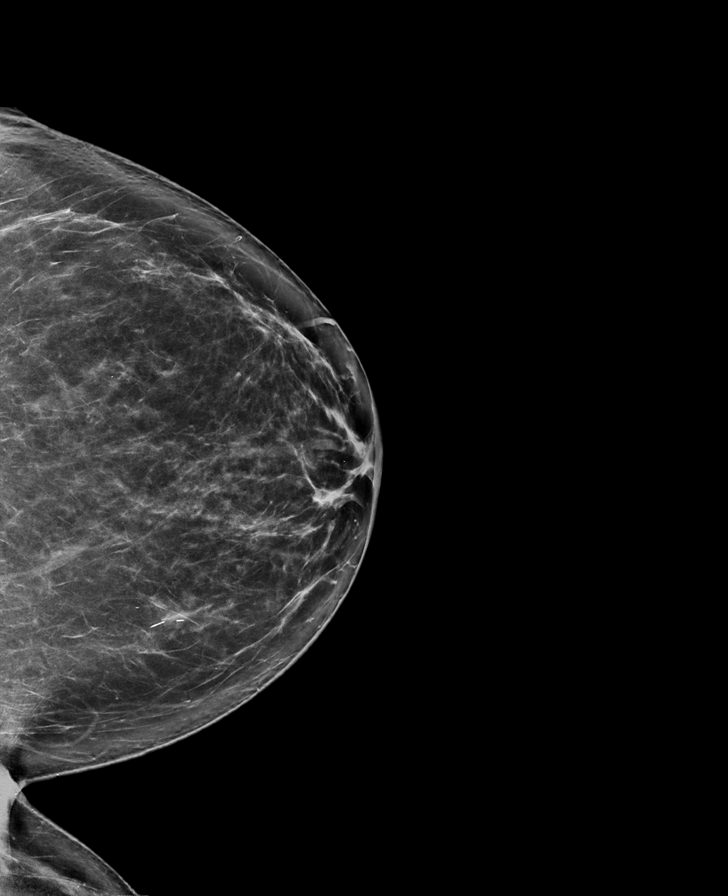

[R MLO synth-2D]
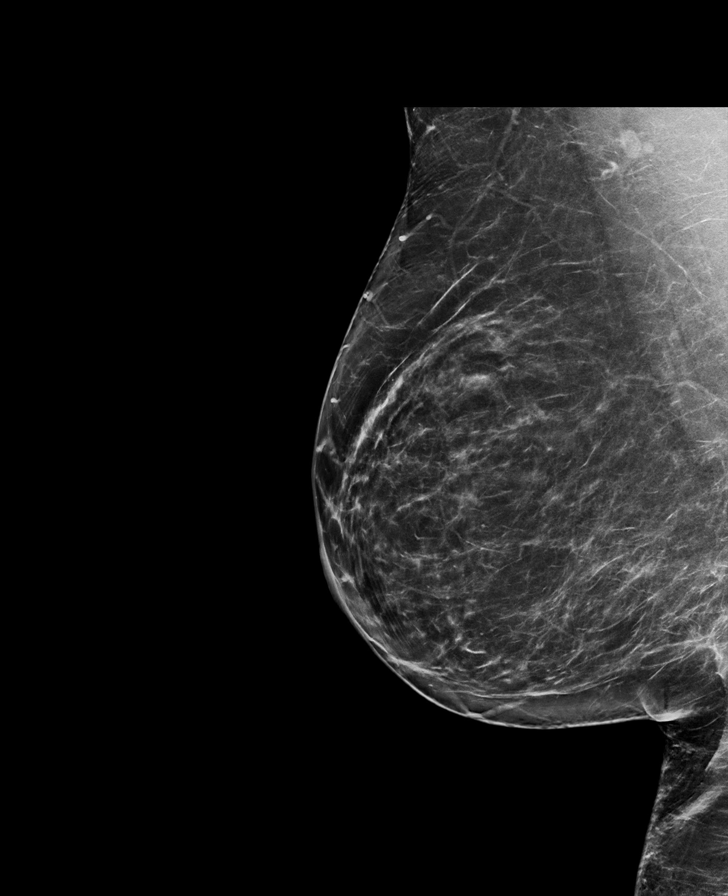

[L MLO synth-2D]
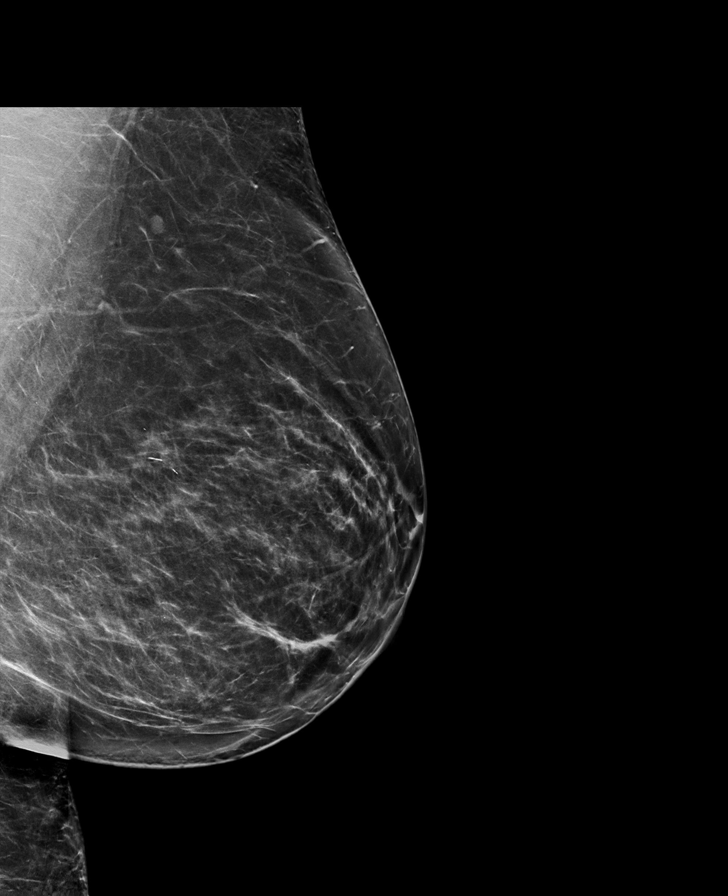

[R CC synth-2D]
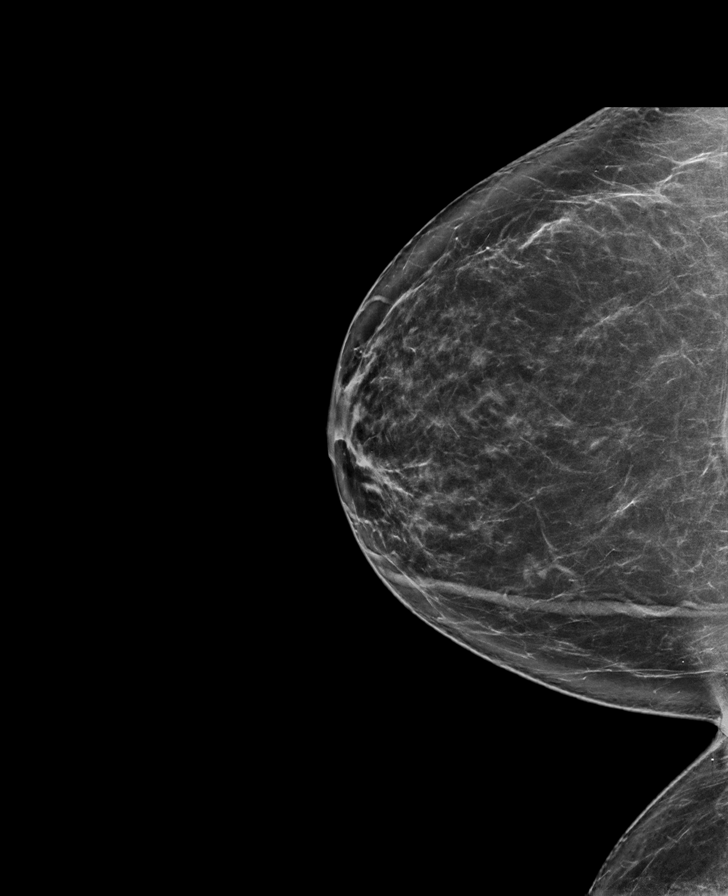

[R MLO tomo · tomo slice 43/85.0]
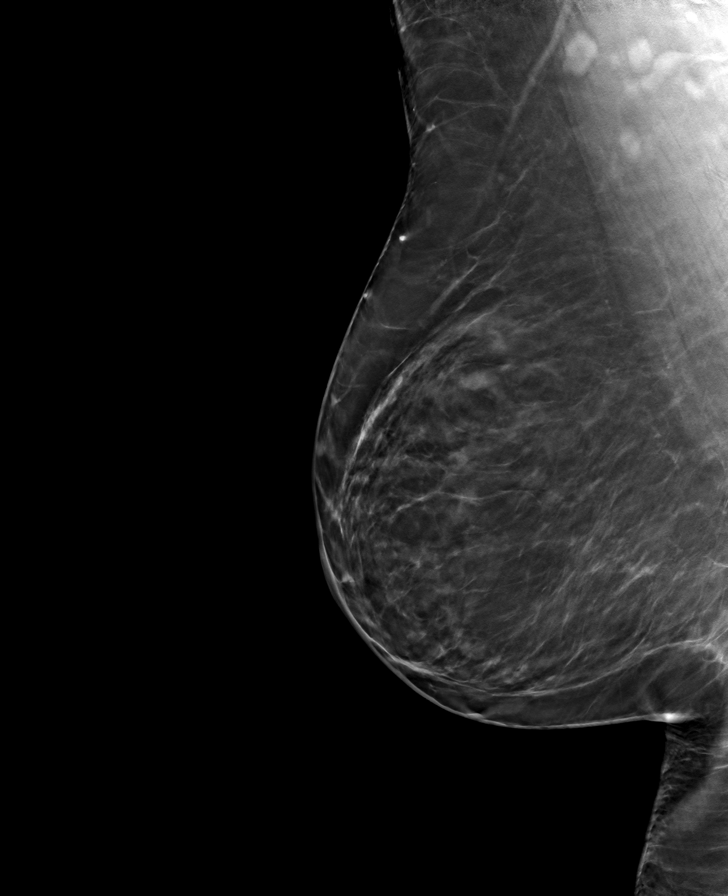

[L CC tomo · tomo slice 41/82.0]
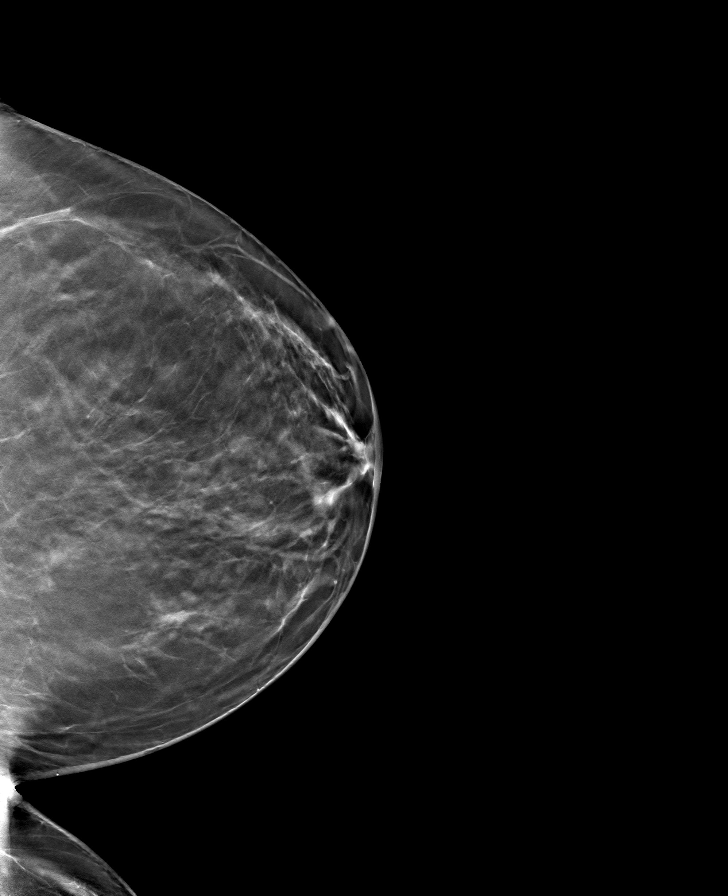

[L MLO tomo · tomo slice 43/84.0]
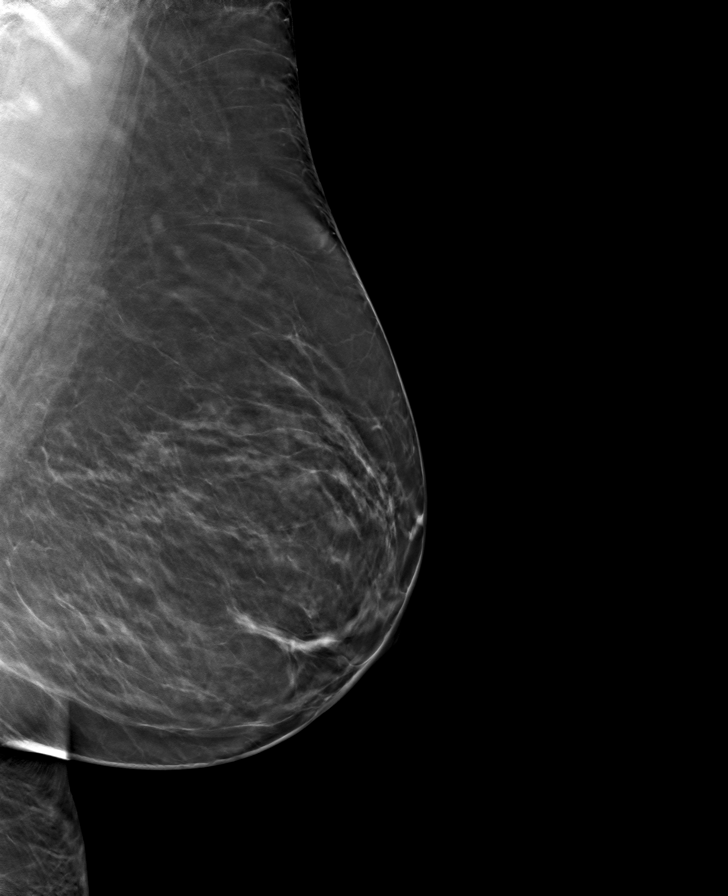

[R CC tomo · tomo slice 41/82.0]
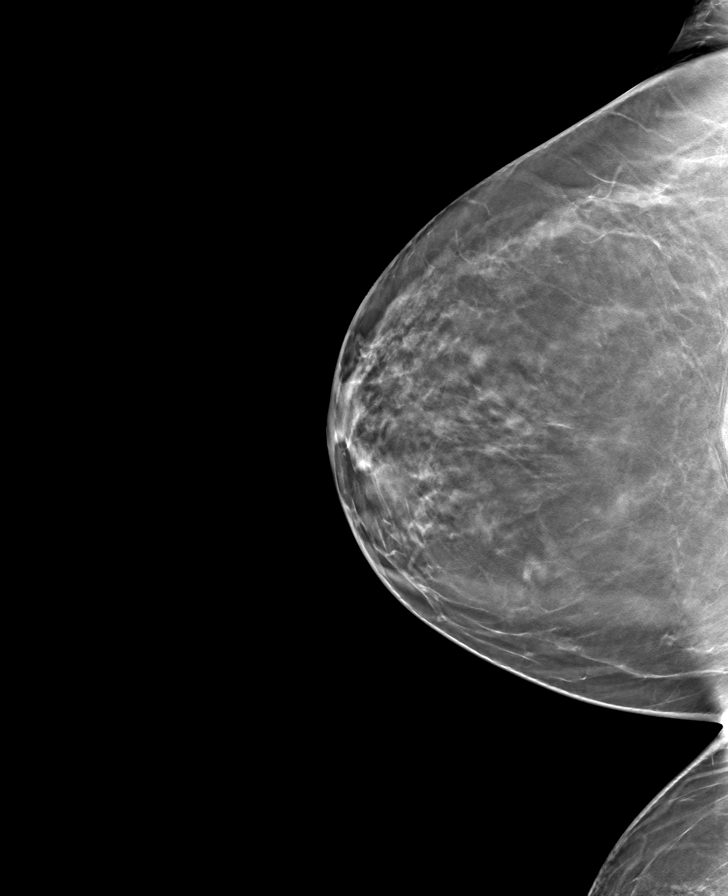

[8 of 24 positions shown; findings below may reference images not displayed]

ACR Breast Density Category b: There are scattered areas of
fibroglandular density.
FINDINGS: There are no findings suspicious for malignancy.
IMPRESSION: No mammographic evidence of malignancy. A result letter of this
screening mammogram will be mailed directly to the patient.

RECOMMENDATION:
Screening mammogram in one year. (Code:51-O-LD2)

BI-RADS CATEGORY  1: Negative.

## 2023-11-12 DIAGNOSIS — H02834 Dermatochalasis of left upper eyelid: Secondary | ICD-10-CM | POA: Diagnosis not present

## 2023-11-12 DIAGNOSIS — G245 Blepharospasm: Secondary | ICD-10-CM | POA: Diagnosis not present

## 2023-11-12 DIAGNOSIS — H02401 Unspecified ptosis of right eyelid: Secondary | ICD-10-CM | POA: Diagnosis not present

## 2023-11-12 DIAGNOSIS — H01009 Unspecified blepharitis unspecified eye, unspecified eyelid: Secondary | ICD-10-CM | POA: Diagnosis not present

## 2023-11-12 DIAGNOSIS — H0288A Meibomian gland dysfunction right eye, upper and lower eyelids: Secondary | ICD-10-CM | POA: Diagnosis not present

## 2023-11-12 DIAGNOSIS — B880 Other acariasis: Secondary | ICD-10-CM | POA: Diagnosis not present

## 2023-11-12 DIAGNOSIS — H02831 Dermatochalasis of right upper eyelid: Secondary | ICD-10-CM | POA: Diagnosis not present

## 2023-11-12 DIAGNOSIS — L718 Other rosacea: Secondary | ICD-10-CM | POA: Diagnosis not present

## 2023-11-12 DIAGNOSIS — H43813 Vitreous degeneration, bilateral: Secondary | ICD-10-CM | POA: Diagnosis not present

## 2023-11-12 DIAGNOSIS — H0288B Meibomian gland dysfunction left eye, upper and lower eyelids: Secondary | ICD-10-CM | POA: Diagnosis not present

## 2023-11-12 DIAGNOSIS — H16223 Keratoconjunctivitis sicca, not specified as Sjogren's, bilateral: Secondary | ICD-10-CM | POA: Diagnosis not present

## 2023-11-12 DIAGNOSIS — H40053 Ocular hypertension, bilateral: Secondary | ICD-10-CM | POA: Diagnosis not present

## 2023-11-29 DIAGNOSIS — H0288A Meibomian gland dysfunction right eye, upper and lower eyelids: Secondary | ICD-10-CM | POA: Diagnosis not present

## 2023-11-29 DIAGNOSIS — H01009 Unspecified blepharitis unspecified eye, unspecified eyelid: Secondary | ICD-10-CM | POA: Diagnosis not present

## 2023-11-29 DIAGNOSIS — L718 Other rosacea: Secondary | ICD-10-CM | POA: Diagnosis not present

## 2023-11-29 DIAGNOSIS — B880 Other acariasis: Secondary | ICD-10-CM | POA: Diagnosis not present

## 2023-11-29 DIAGNOSIS — H16223 Keratoconjunctivitis sicca, not specified as Sjogren's, bilateral: Secondary | ICD-10-CM | POA: Diagnosis not present

## 2023-11-29 DIAGNOSIS — H0288B Meibomian gland dysfunction left eye, upper and lower eyelids: Secondary | ICD-10-CM | POA: Diagnosis not present

## 2023-11-29 DIAGNOSIS — H40053 Ocular hypertension, bilateral: Secondary | ICD-10-CM | POA: Diagnosis not present

## 2024-01-03 DIAGNOSIS — H01009 Unspecified blepharitis unspecified eye, unspecified eyelid: Secondary | ICD-10-CM | POA: Diagnosis not present

## 2024-01-03 DIAGNOSIS — H1045 Other chronic allergic conjunctivitis: Secondary | ICD-10-CM | POA: Diagnosis not present

## 2024-01-03 DIAGNOSIS — H0288B Meibomian gland dysfunction left eye, upper and lower eyelids: Secondary | ICD-10-CM | POA: Diagnosis not present

## 2024-01-03 DIAGNOSIS — B880 Other acariasis: Secondary | ICD-10-CM | POA: Diagnosis not present

## 2024-01-03 DIAGNOSIS — H0288A Meibomian gland dysfunction right eye, upper and lower eyelids: Secondary | ICD-10-CM | POA: Diagnosis not present

## 2024-01-03 DIAGNOSIS — L718 Other rosacea: Secondary | ICD-10-CM | POA: Diagnosis not present

## 2024-01-03 DIAGNOSIS — H16223 Keratoconjunctivitis sicca, not specified as Sjogren's, bilateral: Secondary | ICD-10-CM | POA: Diagnosis not present

## 2024-01-04 DIAGNOSIS — E785 Hyperlipidemia, unspecified: Secondary | ICD-10-CM | POA: Diagnosis not present

## 2024-01-04 DIAGNOSIS — E559 Vitamin D deficiency, unspecified: Secondary | ICD-10-CM | POA: Diagnosis not present

## 2024-01-04 DIAGNOSIS — J069 Acute upper respiratory infection, unspecified: Secondary | ICD-10-CM | POA: Diagnosis not present

## 2024-01-10 ENCOUNTER — Telehealth (INDEPENDENT_AMBULATORY_CARE_PROVIDER_SITE_OTHER): Payer: Self-pay

## 2024-01-10 DIAGNOSIS — H6983 Other specified disorders of Eustachian tube, bilateral: Secondary | ICD-10-CM | POA: Diagnosis not present

## 2024-01-10 NOTE — Telephone Encounter (Signed)
 Pt called wants to be seen with Teoh. Pt stated she is having a pain in her ear that has been going on for about a week now.

## 2024-01-21 DIAGNOSIS — Z23 Encounter for immunization: Secondary | ICD-10-CM | POA: Diagnosis not present

## 2024-01-25 ENCOUNTER — Encounter (INDEPENDENT_AMBULATORY_CARE_PROVIDER_SITE_OTHER): Payer: Self-pay | Admitting: Otolaryngology

## 2024-01-25 ENCOUNTER — Ambulatory Visit (INDEPENDENT_AMBULATORY_CARE_PROVIDER_SITE_OTHER): Admitting: Otolaryngology

## 2024-01-25 VITALS — BP 128/81 | Temp 97.7°F

## 2024-01-25 DIAGNOSIS — H6982 Other specified disorders of Eustachian tube, left ear: Secondary | ICD-10-CM | POA: Diagnosis not present

## 2024-01-25 DIAGNOSIS — J310 Chronic rhinitis: Secondary | ICD-10-CM

## 2024-01-25 NOTE — Progress Notes (Signed)
 Patient ID: Dana Cobb, female   DOB: [birth year], 73 y.o.   MRN: [id number]  New complaint: Left ear discomfort  Discussed the use of AI scribe software for clinical note transcription with the patient, who gave verbal consent to proceed.  History of Present Illness Dana Cobb is a 73 year old female who presents with left ear discomfort.  She has been experiencing discomfort in her left ear for the past three weeks, describing it as a 'swollen inside' feeling, akin to a 'funny sensation' from a pimple that hasn't come to a head. The discomfort is not severe enough to require regular pain medication, but she occasionally takes aspirin to help concentrate while driving.  She visited urgent care, where she was told she might have the beginning of an ear infection and was prescribed antibiotics for ten days. No recent nasal congestion or allergies are reported, and she does not believe her hearing is affected by the ear discomfort.  She is concerned about her ear discomfort affecting her upcoming travel plans to [place], as she does not want to experience pain while flying. She is planning a trip to [place] for a shrine pilgrimage as part of a special jubilee year. Being Catholic, she feels more comfortable traveling with a church group.   Exam: General: Communicates without difficulty, well nourished, no acute distress. Head: Normocephalic, no evidence injury, no tenderness, facial buttresses intact without stepoff. Face/sinus: No tenderness to palpation and percussion. Facial movement is normal and symmetric. Eyes: PERRL, EOMI. No scleral icterus, conjunctivae clear. Neuro: CN II exam reveals vision grossly intact.  No nystagmus at any point of gaze. Ears: Auricles well formed without lesions.  Ear canals are intact without mass or lesion.  No erythema or edema is appreciated.  The TMs are intact without fluid. Nose: External evaluation reveals normal support and skin without lesions.   Dorsum is intact.  Anterior rhinoscopy reveals congested mucosa over anterior aspect of inferior turbinates and intact septum.  No purulence noted. Oral:  Oral cavity and oropharynx are intact, symmetric, without erythema or edema.  Mucosa is moist without lesions. Neck: Full range of motion without pain.  There is no significant lymphadenopathy.  No masses palpable.  Thyroid  bed within normal limits to palpation.  Parotid glands and submandibular glands equal bilaterally without mass.  Trachea is midline. Neuro:  CN 2-12 grossly intact.   Assessment and Plan Assessment & Plan Eustachian tube dysfunction Eustachian tube dysfunction with left ear pressure and discomfort, likely due to seasonal allergies causing congestion. No significant pain or hearing loss. Examination shows normal ear anatomy with pristine eardrums and middle ear spaces. Air travel may exacerbate symptoms due to pressure changes. - Use Nasacort nasal spray, two sprays in each nostril daily, aiming towards the ear, until symptoms resolve. - Perform Valsalva maneuver 20-30 times daily to help equalize ear pressure. - Continue Valsalva maneuver during flights to manage pressure changes.

## 2024-04-07 ENCOUNTER — Ambulatory Visit: Admitting: Gastroenterology

## 2024-04-07 ENCOUNTER — Encounter: Payer: Self-pay | Admitting: Gastroenterology

## 2024-04-07 VITALS — BP 120/70 | HR 73 | Ht 63.0 in | Wt 170.0 lb

## 2024-04-07 DIAGNOSIS — R1012 Left upper quadrant pain: Secondary | ICD-10-CM | POA: Diagnosis not present

## 2024-04-07 DIAGNOSIS — Z800 Family history of malignant neoplasm of digestive organs: Secondary | ICD-10-CM | POA: Diagnosis not present

## 2024-04-07 DIAGNOSIS — R1011 Right upper quadrant pain: Secondary | ICD-10-CM

## 2024-04-07 DIAGNOSIS — K219 Gastro-esophageal reflux disease without esophagitis: Secondary | ICD-10-CM

## 2024-04-07 NOTE — Patient Instructions (Signed)
 Continue Omeprazole  for GERD.  You have been scheduled for an abdominal ultrasound at [facility] (1st floor of hospital) on 04/17/24 at 8:30 am. Please arrive 30 minutes prior to your appointment for registration. Make certain not to have anything to eat or drink after midnight prior to your appointment. Should you need to reschedule your appointment, please contact radiology at [phone]. This test typically takes about 30 minutes to perform.   You have been scheduled for an endoscopy. Please follow written instructions given to you at your visit today.  If you use inhalers (even only as needed), please bring them with you on the day of your procedure.  If you take any of the following medications, they will need to be adjusted prior to your procedure:   DO NOT TAKE 7 DAYS PRIOR TO TEST- Trulicity (dulaglutide) Ozempic, Wegovy (semaglutide) Mounjaro, Zepbound (tirzepatide) Bydureon Bcise (exanatide extended release)  DO NOT TAKE 1 DAY PRIOR TO YOUR TEST Rybelsus (semaglutide) Adlyxin (lixisenatide) Victoza (liraglutide) Byetta (exanatide) ___________________________________________________________________________

## 2024-04-07 NOTE — Progress Notes (Signed)
 "  Dana Cobb [id number] April 03, 1951   Chief Complaint: GERD, dry cough, abdominal pain  Referring Provider: Norleen Lynwood ORN, MD Primary GI MD: Dr. Legrand  HPI: Dana Cobb is a 73 y.o. female with past medical history of colon polyps, GERD, HLD, osteopenia, hysterectomy who presents today for a complaint of GERD and dry cough.    Patient last seen in office 03/28/2019 by Dr. Legrand for complaint of left upper quadrant pain.  Plan was for CBC, CMP, H. pylori antibody, and if normal plan was to monitor.  She was given samples of FDgard in case a possible functional component was present.  Patient has history of colon cancer in her mother, last colonoscopy 03/08/2023.  Seen by ENT 01/25/2024, diagnosis of eustachian tube dysfunction with left ear pressure and discomfort, likely due to seasonal allergies causing congestion.   Discussed the use of AI scribe software for clinical note transcription with the patient, who gave verbal consent to proceed.  History of Present Illness Dana Cobb is a 73 year old female with gastroesophageal reflux disease who presents with worsening reflux symptoms.  Gastroesophageal reflux symptoms - Increased frequency of heartburn and acid reflux over recent months, now occurring 2-3 times per week - Symptoms triggered by pizza, processed tomato sauces, wine, and coffee; fresh tomatoes do not provoke symptoms - Manages symptoms by adjusting meal timing, avoiding large meals at night, and using omeprazole  as needed (prefers not to take regularly) - Baking soda in water provides rapid relief; avoids Tums and minimizes medication use - Occasional nocturnal regurgitation and increased mucous production, especially when digestion feels sluggish - No dysphagia or sensation of food impaction - No prior upper endoscopy  Digestive function and dietary habits - Sluggish digestion, particularly after larger or later meals, as experienced during  recent travel - Adapts by consuming largest meal at lunch and lighter meals at dinner; avoids spicy foods - Diet high in fiber, including vegetables, sweet potatoes, and yogurt - Drinks one cup of coffee daily with lactose-free milk  Left upper quadrant abdominal discomfort - Intermittent discomfort under the left ribs described as a twitch or funky pain - Discomfort is inconsistent, not reproducible, and occurs when lying on right side - No pain on the right side - No recent abdominal imaging  Bowel habits and weight changes - Bowel movements regular without blood or melena - No unintentional weight loss outside of a prior episode of pneumonia, during which she lost approximately ten pounds due to severe cough and inability to eat; weight has since been maintained  Ocular and facial symptoms - Rapidly developed dry eye - Left-sided facial puffiness; other clinicians have suggested possible parotid gland involvement - Question of possible Sjogren's syndrome, but no formal diagnosis  Otolaryngologic symptoms - Ear discomfort evaluated as Eustachian tube dysfunction, likely related to allergies  Cardiopulmonary symptoms - No new chest pain or shortness of breath, except when out of shape   Previous GI Procedures/Imaging   Colonoscopy 03/08/2023 - Two diminutive polyps in the ascending colon and in the cecum, removed with a cold snare. Resected and retrieved.  - Diverticulosis in the left colon and in the right colon.  - Redundant colon.  - Internal hemorrhoids.  - The examination was otherwise normal on direct and retroflexion views. - Recall 5 years Path: 1. Surgical [P], colon, cecum, ascending, polyp (2) :       TUBULAR ADENOMA, 3 FRAGMENTS       NEGATIVE FOR HIGH-GRADE DYSPLASIA AND CARCINOMA  Past Medical History:  Diagnosis Date   Allergic rhinitis    Allergy     Cataract    Colon polyps 09/24/2017   GERD (gastroesophageal reflux disease) 09/24/2017    Hyperlipidemia    Osteopenia 09/24/2017    Past Surgical History:  Procedure Laterality Date   BUNIONECTOMY Left    COLONOSCOPY     Gartner duct cyst     HAMMER TOE SURGERY Left    TONSILLECTOMY AND ADENOIDECTOMY     TURBINATE REDUCTION Bilateral    UPPER GASTROINTESTINAL ENDOSCOPY     VAGINAL HYSTERECTOMY     WISDOM TOOTH EXTRACTION      Current Outpatient Medications  Medication Sig Dispense Refill   chlorpheniramine (CHLOR-TRIMETON) 4 MG tablet Take 4 mg by mouth 2 (two) times daily as needed for allergies.     ezetimibe (ZETIA) 10 MG tablet Take 10 mg by mouth daily.     Glycerin (OPTASE COMFORT DRY EYE OP) Apply to eye at bedtime.     guaiFENesin (MUCINEX PO) Take by mouth as needed.     MAGNESIUM PO Take by mouth daily.  (Patient taking differently: Take by mouth daily. Magnesium  L-theo 144mg  every other day)     Multiple Vitamins-Minerals (ICAPS AREDS FORMULA PO) Take by mouth.     Olopatadine HCl (PATADAY OP) Apply to eye 2 (two) times daily.     Omega-3 1000 MG CAPS Twice a day     omeprazole  (PRILOSEC) 40 MG capsule Take 1 capsule (40 mg total) by mouth as needed. 90 capsule 3   OVER THE COUNTER MEDICATION Vitamin D  600 mg, One capsule daily.     Psyllium (METAMUCIL PO) Take by mouth as needed.     VEVYE 0.1 % SOLN Apply 1 drop to eye 2 (two) times daily.     No current facility-administered medications for this visit.    Allergies as of 04/07/2024 - Review Complete 04/07/2024  Allergen Reaction Noted   Codeine Anaphylaxis 06/29/2017   Corylus Cough and Other (See Comments) 04/07/2024   Lipitor [atorvastatin ] Other (See Comments) 01/20/2021    Family History  Problem Relation Age of Onset   Heart disease Mother    Stroke Mother    Colon cancer Mother    Heart disease Father    Hyperlipidemia Father    Stroke Maternal Grandmother    Cancer Maternal Grandmother    Aneurysm Maternal Grandfather    Esophageal cancer Neg Hx    Rectal cancer Neg Hx    Stomach  cancer Neg Hx    Colon polyps Neg Hx     Social History[1]   Review of Systems:    Constitutional: No unexplained weight loss, fever, chills Eyes: Dry eyes Ears, Nose, Throat: Eustachian tube dysfunction, congestion Cardiovascular: No chest pain Respiratory: No SOB Gastrointestinal: See HPI and otherwise negative   Physical Exam:  Vital signs: BP 120/70   Pulse 73   Ht 5' 3 (1.6 m)   Wt 170 lb (77.1 kg)   BMI 30.11 kg/m   Constitutional: Pleasant, overweight female in NAD, alert and cooperative Head:  Normocephalic and atraumatic.  Respiratory: Respirations even and unlabored. Lungs clear to auscultation bilaterally.  No wheezes, crackles, or rhonchi.  Cardiovascular:  Regular rate and rhythm. No murmurs. No peripheral edema. Gastrointestinal:  Soft, nondistended, nontender. No rebound or guarding. Normal bowel sounds. No appreciable masses or hepatomegaly. Rectal:  Not performed.  Neurologic:  Alert and oriented x4;  grossly normal neurologically.  Skin:   Dry and  intact without significant lesions or rashes. Psychiatric: Oriented to person, place and time. Demonstrates good judgement and reason without abnormal affect or behaviors.   RELEVANT LABS AND IMAGING: CBC    Component Value Date/Time   WBC 6.4 01/20/2021 0943   RBC 4.74 01/20/2021 0943   HGB 14.5 01/20/2021 0943   HCT 41.5 01/20/2021 0943   PLT 272.0 01/20/2021 0943   MCV 87.6 01/20/2021 0943   MCHC 34.9 01/20/2021 0943   RDW 13.2 01/20/2021 0943   LYMPHSABS 1.4 01/20/2021 0943   MONOABS 0.4 01/20/2021 0943   EOSABS 0.1 01/20/2021 0943   BASOSABS 0.0 01/20/2021 0943    CMP     Component Value Date/Time   NA 140 01/20/2021 0943   K 4.5 01/20/2021 0943   CL 103 01/20/2021 0943   CO2 29 01/20/2021 0943   GLUCOSE 89 01/20/2021 0943   BUN 13 01/20/2021 0943   CREATININE 0.74 01/20/2021 0943   CALCIUM  9.7 01/20/2021 0943   PROT 7.2 01/20/2021 0943   ALBUMIN 4.4 01/20/2021 0943   AST 19 01/20/2021  0943   ALT 17 01/20/2021 0943   ALKPHOS 63 01/20/2021 0943   BILITOT 0.4 01/20/2021 0943     Assessment/Plan:   Assessment & Plan Gastroesophageal reflux disease Chronic GERD with increased frequency, partially controlled with lifestyle changes and as-needed omeprazole . Occasional nocturnal regurgitation and mucus noted. No prior upper endoscopy.  - Provided educational handout on lifestyle modifications. - Ordered upper endoscopy. I thoroughly discussed the procedure with the patient to include nature of the procedure, alternatives, benefits, and risks (including but not limited to bleeding, infection, perforation, anesthesia/cardiac/pulmonary complications). Patient verbalized understanding and gave verbal consent to proceed with procedure.  - Advised continuation of omeprazole  as needed, consider daily use if endoscopy reveals esophageal damage.  Left upper quadrant abdominal pain Intermittent left upper quadrant pain of unclear etiology, possibly muscular or related to gastrointestinal gas. Family history of bile duct cancer noted.  - Ordered abdominal ultrasound to evaluate for underlying pathology. - Discussed potential CT scan if ultrasound is unrevealing.    Camie Furbish, PA-C [facility] 04/07/2024, 10:15 AM  Patient Care Team: Norleen Lynwood ORN, MD as PCP - [facility] as Consulting Physician (Ophthalmology)       [1]  Social History Tobacco Use   Smoking status: Never   Smokeless tobacco: Never  Vaping Use   Vaping status: Never Used  Substance Use Topics   Alcohol use: Yes    Comment: Occasional wine   Drug use: No   "

## 2024-04-11 NOTE — Progress Notes (Signed)
 ____________________________________________________________  Attending physician addendum:  Thank you for sending this case to me. I have reviewed the entire note and agree with the plan.  The LUQ pain continues to sound benign. EGD reasonable to evaluate for reflux complications.  Per usual, diet and lifestyle measures essential for control.  Victory Brand, MD  ____________________________________________________________

## 2024-04-17 ENCOUNTER — Ambulatory Visit (HOSPITAL_COMMUNITY)
Admission: RE | Admit: 2024-04-17 | Discharge: 2024-04-17 | Disposition: A | Discharge status: Home / Self Care | Source: Ambulatory Visit | Attending: Gastroenterology | Admitting: Gastroenterology

## 2024-04-17 DIAGNOSIS — R1011 Right upper quadrant pain: Secondary | ICD-10-CM | POA: Diagnosis present

## 2024-04-17 DIAGNOSIS — R1012 Left upper quadrant pain: Secondary | ICD-10-CM | POA: Insufficient documentation

## 2024-04-24 ENCOUNTER — Telehealth: Payer: Self-pay | Admitting: Gastroenterology

## 2024-04-24 ENCOUNTER — Ambulatory Visit: Payer: Self-pay | Admitting: Gastroenterology

## 2024-04-24 NOTE — Telephone Encounter (Signed)
 Inbound call from Nurse Gabby at Radiology stating results for ultrasound are in. Please advise  Thank you

## 2024-04-24 NOTE — Telephone Encounter (Signed)
 Patient returned phone call. Please advise, thank you

## 2024-04-24 NOTE — Telephone Encounter (Signed)
Noted-see results note. 

## 2024-04-25 ENCOUNTER — Other Ambulatory Visit: Payer: Self-pay

## 2024-04-25 DIAGNOSIS — K769 Liver disease, unspecified: Secondary | ICD-10-CM

## 2024-04-27 ENCOUNTER — Ambulatory Visit (AMBULATORY_SURGERY_CENTER): Admitting: Gastroenterology

## 2024-04-27 ENCOUNTER — Encounter: Payer: Self-pay | Admitting: Gastroenterology

## 2024-04-27 VITALS — BP 119/60 | HR 67 | Temp 97.0°F | Resp 11 | Ht 63.0 in | Wt 170.0 lb

## 2024-04-27 DIAGNOSIS — K449 Diaphragmatic hernia without obstruction or gangrene: Secondary | ICD-10-CM | POA: Diagnosis not present

## 2024-04-27 DIAGNOSIS — K2100 Gastro-esophageal reflux disease with esophagitis, without bleeding: Secondary | ICD-10-CM

## 2024-04-27 MED ORDER — SODIUM CHLORIDE 0.9 % IV SOLN: 500.0000 mL | Freq: Once | INTRAVENOUS | Status: DC

## 2024-04-27 NOTE — Progress Notes (Signed)
 Pt's states no medical or surgical changes since previsit or office visit.

## 2024-04-27 NOTE — Progress Notes (Signed)
 Vss nad trans to pacu

## 2024-04-27 NOTE — Progress Notes (Signed)
 No significant changes to clinical history since GI office visit on 04/07/24.  The patient is appropriate for an endoscopic procedure in the ambulatory setting.  - Victory Brand, MD

## 2024-04-27 NOTE — Patient Instructions (Signed)
 Please read handouts provided. Continue present medications. Resume previous diet. Consider taking omeprazole  at least every other day.   YOU HAD AN ENDOSCOPIC PROCEDURE TODAY AT THE [facility]:   Refer to the procedure report that was given to you for any specific questions about what was found during the examination.  If the procedure report does not answer your questions, please call your gastroenterologist to clarify.  If you requested that your care partner not be given the details of your procedure findings, then the procedure report has been included in a sealed envelope for you to review at your convenience later.  YOU SHOULD EXPECT: Some feelings of bloating in the abdomen. Passage of more gas than usual.  Walking can help get rid of the air that was put into your GI tract during the procedure and reduce the bloating. If you had a lower endoscopy (such as a colonoscopy or flexible sigmoidoscopy) you may notice spotting of blood in your stool or on the toilet paper. If you underwent a bowel prep for your procedure, you may not have a normal bowel movement for a few days.  Please Note:  You might notice some irritation and congestion in your nose or some drainage.  This is from the oxygen used during your procedure.  There is no need for concern and it should clear up in a day or so.  SYMPTOMS TO REPORT IMMEDIATELY:  Following upper endoscopy (EGD)  Vomiting of blood or coffee ground material  New chest pain or pain under the shoulder blades  Painful or persistently difficult swallowing  New shortness of breath  Fever of 100F or higher  Black, tarry-looking stools  For urgent or emergent issues, a gastroenterologist can be reached at any hour by calling [phone]. Do not use MyChart messaging for urgent concerns.    DIET:  We do recommend a small meal at first, but then you may proceed to your regular diet.  Drink plenty of fluids but you should avoid alcoholic  beverages for 24 hours.  ACTIVITY:  You should plan to take it easy for the rest of today and you should NOT DRIVE or use heavy machinery until tomorrow (because of the sedation medicines used during the test).    FOLLOW UP: Our staff will call the number listed on your records the next business day following your procedure.  We will call around 7:15- 8:00 am to check on you and address any questions or concerns that you may have regarding the information given to you following your procedure. If we do not reach you, we will leave a message.     If any biopsies were taken you will be contacted by phone or by letter within the next 1-3 weeks.  Please call us  at [phone] if you have not heard about the biopsies in 3 weeks.    SIGNATURES/CONFIDENTIALITY: You and/or your care partner have signed paperwork which will be entered into your electronic medical record.  These signatures attest to the fact that that the information above on your After Visit Summary has been reviewed and is understood.  Full responsibility of the confidentiality of this discharge information lies with you and/or your care-partner.

## 2024-04-27 NOTE — Op Note (Signed)
 [facility] Patient Name: Rudie Rikard Procedure Date: 04/27/2024 10:38 AM MRN: [id number] Endoscopist: Victory L. Legrand , MD, [id number] Age: 74 Referring MD:  Date of Birth: May 02, 1950 Gender: Female Account #: [id number] Procedure:                Upper GI endoscopy Indications:              Esophageal reflux symptoms that recur despite                            appropriate therapy Medicines:                Monitored Anesthesia Care Procedure:                Pre-Anesthesia Assessment:                           - Prior to the procedure, a History and Physical                            was performed, and patient medications and                            allergies were reviewed. The patient's tolerance of                            previous anesthesia was also reviewed. The risks                            and benefits of the procedure and the sedation                            options and risks were discussed with the patient.                            All questions were answered, and informed consent                            was obtained. Prior Anticoagulants: The patient has                            taken no anticoagulant or antiplatelet agents. ASA                            Grade Assessment: II - A patient with mild systemic                            disease. After reviewing the risks and benefits,                            the patient was deemed in satisfactory condition to                            undergo the procedure.  After obtaining informed consent, the endoscope was                            passed under direct vision. Throughout the                            procedure, the patient's blood pressure, pulse, and                            oxygen saturations were monitored continuously. The                            GIF [id number] #[id number] was introduced through the                            mouth, and advanced to the second  part of duodenum.                            The upper GI endoscopy was accomplished without                            difficulty. The patient tolerated the procedure                            well. Scope In: Scope Out: Findings:                 LA Grade A (one or more mucosal breaks less than 5                            mm, not extending between tops of 2 mucosal folds)                            esophagitis with no bleeding was found at the                            gastroesophageal junction. That tissue was mildly                            friable with scope passage.                           Distal esophagus with mild tortuosity related to                            the hiatal hernia.                           A 2-3 cm hiatal hernia was present.                           The stomach was normal. Belching of insufflated air                            in the retroflexed  position due to the hiatal                            hernia precluded good retroflexed visualization and                            photodocumentation in that position. Careful                            forward view examination of the area was performed.                           The examined duodenum was normal. Complications:            No immediate complications. Estimated Blood Loss:     Estimated blood loss: none. Impression:               - LA Grade A reflux esophagitis with no bleeding.                           - 2 cm hiatal hernia.                           - Normal stomach.                           - Normal examined duodenum.                           - No specimens collected. Recommendation:           - Patient has a contact number available for                            emergencies. The signs and symptoms of potential                            delayed complications were discussed with the                            patient. Return to normal activities tomorrow.                            Written  discharge instructions were provided to the                            patient.                           - Resume previous diet.                           - Continue present medications.                           - Consider taking your omeprazole  at least every  other day (rather than as needed) to decrease the                            chance of developing a reflux related stricture at                            the EG junction. There is a short segment of                            low-grade reflux related esophagitis in that area.                           Reflux related diet and lifestyle measures are                            equally as important as medicines to help control                            the GERD. Elevating the head of bed with a                            mechanical bed frame of bed wedge would also                            decrease any nocturnal reflux that may be occurring. Gaither Biehn L. Legrand, MD 04/27/2024 11:06:11 AM This report has been signed electronically.

## 2024-04-28 ENCOUNTER — Telehealth: Payer: Self-pay

## 2024-04-28 NOTE — Telephone Encounter (Signed)
" °  Follow up Call-     04/27/2024    9:28 AM 03/08/2023    1:50 PM  Call back number  Post procedure Call Back phone  # [phone]  Permission to leave phone message Yes Yes     Patient questions:  Do you have a fever, pain , or abdominal swelling? No. Pain Score  0 *  Have you tolerated food without any problems? Yes.    Have you been able to return to your normal activities? Yes.    Do you have any questions about your discharge instructions: Diet   No. Medications  No. Follow up visit  No.  Do you have questions or concerns about your Care? No.  Actions: * If pain score is 4 or above: No action needed, pain <4.   "

## 2024-05-03 ENCOUNTER — Ambulatory Visit (HOSPITAL_COMMUNITY)
Admission: RE | Admit: 2024-05-03 | Discharge: 2024-05-03 | Disposition: A | Discharge status: Home / Self Care | Source: Ambulatory Visit | Attending: Gastroenterology | Admitting: Gastroenterology

## 2024-05-03 DIAGNOSIS — K769 Liver disease, unspecified: Secondary | ICD-10-CM | POA: Insufficient documentation

## 2024-05-03 MED ORDER — GADOBUTROL 1 MMOL/ML IV SOLN
7.5000 mL | Freq: Once | INTRAVENOUS | Status: AC | PRN
  Administered 2024-05-03: 7.5 mL via INTRAVENOUS

## 2024-05-09 ENCOUNTER — Ambulatory Visit: Payer: Self-pay | Admitting: Gastroenterology

## 2024-05-16 ENCOUNTER — Encounter (HOSPITAL_COMMUNITY): Payer: Self-pay | Admitting: Gastroenterology

## 2024-05-16 ENCOUNTER — Other Ambulatory Visit: Payer: Self-pay

## 2024-05-16 DIAGNOSIS — K769 Liver disease, unspecified: Secondary | ICD-10-CM

## 2024-05-17 ENCOUNTER — Encounter (INDEPENDENT_AMBULATORY_CARE_PROVIDER_SITE_OTHER): Payer: Self-pay | Admitting: Otolaryngology

## 2024-05-17 ENCOUNTER — Ambulatory Visit (INDEPENDENT_AMBULATORY_CARE_PROVIDER_SITE_OTHER): Admitting: Otolaryngology

## 2024-05-17 VITALS — BP 153/89 | HR 80

## 2024-05-17 DIAGNOSIS — H6990 Unspecified Eustachian tube disorder, unspecified ear: Secondary | ICD-10-CM

## 2024-05-17 DIAGNOSIS — H6982 Other specified disorders of Eustachian tube, left ear: Secondary | ICD-10-CM

## 2024-05-17 DIAGNOSIS — J310 Chronic rhinitis: Secondary | ICD-10-CM

## 2024-05-17 NOTE — Progress Notes (Unsigned)
 Patient ID: Dana Cobb, female   DOB: [birth year], 74 y.o.   MRN: [id number]  Follow up: Left ear eustachian tube dysfunction, chronic rhinitis  History of Present Illness Dana Cobb is a 74 year old female with chronic left-sided Eustachian tube dysfunction and allergic rhinitis who presents for otolaryngology follow-up.  She reports persistent left ear symptoms, including difficulty equalizing pressure, particularly during air travel. She performs regular Valsalva maneuvers, which she incorporates into her nightly routine. She denies recent otitis media or significant otalgia. She notes improvement in eye and ear symptoms since initiating Tyrvaya nasal spray, though this occasionally causes violent sneezing and transient rhinorrhea.  She continues to experience dry eye symptoms, predominantly on the left, and is managed by ophthalmology with two prescribed eye drops. She recently started Tyrvaya nasal spray, which she believes has improved both ocular and otologic symptoms. She has observed persistent left cheek puffiness and a palpable mass in the left cheek region, which she associates with her chronic dry eye and ear complaints. Multiple dental providers have deferred evaluation of the mass in her left cheek.  She is preparing to begin allergy  immunotherapy for significant seasonal allergic symptoms, most pronounced in the spring, though initiation has been delayed due to recent travel and family obligations. She has reactive airways and maintains access to EpiPens for potential anaphylaxis. She denies nasal obstruction or recurrent sinusitis since prior nasal surgery and reports substantial improvement in nasal breathing postoperatively.  She recently traveled to [place] for ten days, during which she experienced some difficulty with ear pressure but was able to manage symptoms effectively and participate fully in activities.    Exam: General: Communicates without difficulty,  well nourished, no acute distress. Head: Normocephalic, no evidence injury, no tenderness, facial buttresses intact without stepoff. Face/sinus: No tenderness to palpation and percussion. Facial movement is normal and symmetric. Eyes: PERRL, EOMI. No scleral icterus, conjunctivae clear. Neuro: CN II exam reveals vision grossly intact.  No nystagmus at any point of gaze. Ears: Auricles well formed without lesions.  Ear canals are intact without mass or lesion.  No erythema or edema is appreciated.  The TMs are intact without fluid. Nose: External evaluation reveals normal support and skin without lesions.  Dorsum is intact.  Anterior rhinoscopy reveals congested mucosa over anterior aspect of inferior turbinates and intact septum.  No purulence noted. Oral:  Oral cavity and oropharynx are intact, symmetric, without erythema or edema.  Mucosa is moist without lesions. Neck: Full range of motion without pain.  There is no significant lymphadenopathy.  No masses palpable.  Thyroid  bed within normal limits to palpation.  Parotid glands and submandibular glands equal bilaterally without mass.  Trachea is midline. Neuro:  CN 2-12 grossly intact. Assessment & Plan Eustachian tube dysfunction Chronic Eustachian tube dysfunction, with symptoms exacerbated by air travel and seasonal allergies. Currently well controlled with regular Valsalva maneuvers and intranasal corticosteroid. No evidence of infection or acute pathology on examination. Anticipated increased symptoms during spring allergy  season; ongoing allergy  immunotherapy planned due to reactive airways. No concerning findings on today's exam. - Continued Nasacort nasal spray daily. - Recommended ongoing Valsalva maneuvers, especially during symptom-provoking activities. - Initiation of allergy  immunotherapy with allergist, with slow titration due to reactive airways. - Provided anticipatory guidance regarding potential for increased symptoms during spring  allergy  season; advised to contact office if infections or significant issues arise. -No palpable parotid mass is noted today. - Scheduled follow-up in six months unless symptoms worsen.

## 2024-05-17 NOTE — Progress Notes (Signed)
 Attempted to obtain medical history for pre op call via telephone, unable to reach at this time. HIPAA compliant voicemail message left requesting return call to pre surgical testing department.

## 2024-05-23 ENCOUNTER — Telehealth: Payer: Self-pay | Admitting: Gastroenterology

## 2024-05-23 NOTE — Telephone Encounter (Signed)
 Ok, Thank you!

## 2024-05-23 NOTE — Telephone Encounter (Addendum)
 Procedure:Upper EUS Procedure date: 05/29/24 Procedure location: [facility] Arrival Time: 9:30 am Spoke with the patient Y/N:   No, I left a detailed message on [phone] on 05/23/24 @ 9:41 am for the patient to return call    Any prep concerns? No Has the patient obtained the prep from the pharmacy ? No prep needed Do you have a care partner and transportation: ___ Any additional concerns? ___

## 2024-05-23 NOTE — Telephone Encounter (Signed)
 PT returned call. Stated that she had a care partner ready and no further concerns at this time.

## 2024-05-29 ENCOUNTER — Ambulatory Visit (HOSPITAL_COMMUNITY): Admission: RE | Admit: 2024-05-29 | Admitting: Gastroenterology

## 2024-05-29 ENCOUNTER — Encounter (HOSPITAL_COMMUNITY): Admission: RE | Payer: Self-pay

## 2024-11-17 ENCOUNTER — Ambulatory Visit (INDEPENDENT_AMBULATORY_CARE_PROVIDER_SITE_OTHER): Admitting: Otolaryngology
